# Patient Record
Sex: Female | Born: 1980 | Race: White | Hispanic: No | Marital: Single | Smoking: Former smoker
Health system: Southern US, Community
[De-identification: ages and names within clinical notes are randomized; demographics above are authoritative.]

## PROBLEM LIST (undated history)

## (undated) DIAGNOSIS — I10 Essential (primary) hypertension: Secondary | ICD-10-CM

## (undated) DIAGNOSIS — F329 Major depressive disorder, single episode, unspecified: Secondary | ICD-10-CM

## (undated) DIAGNOSIS — A749 Chlamydial infection, unspecified: Secondary | ICD-10-CM

## (undated) DIAGNOSIS — A599 Trichomoniasis, unspecified: Secondary | ICD-10-CM

## (undated) DIAGNOSIS — E538 Deficiency of other specified B group vitamins: Secondary | ICD-10-CM

## (undated) DIAGNOSIS — T883XXA Malignant hyperthermia due to anesthesia, initial encounter: Secondary | ICD-10-CM

## (undated) DIAGNOSIS — F32A Depression, unspecified: Secondary | ICD-10-CM

## (undated) DIAGNOSIS — E559 Vitamin D deficiency, unspecified: Secondary | ICD-10-CM

## (undated) DIAGNOSIS — F419 Anxiety disorder, unspecified: Secondary | ICD-10-CM

## (undated) HISTORY — PX: OTHER SURGICAL HISTORY: SHX169

## (undated) HISTORY — DX: Vitamin D deficiency, unspecified: E55.9

## (undated) HISTORY — DX: Deficiency of other specified B group vitamins: E53.8

---
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
## 2001-01-28 ENCOUNTER — Ambulatory Visit (HOSPITAL_COMMUNITY): Admission: RE | Admit: 2001-01-28 | Discharge: 2001-01-28 | Payer: Self-pay | Admitting: Oral Surgery

## 2002-12-07 ENCOUNTER — Emergency Department (HOSPITAL_COMMUNITY): Admission: EM | Admit: 2002-12-07 | Discharge: 2002-12-07 | Payer: Self-pay | Admitting: Emergency Medicine

## 2003-10-04 ENCOUNTER — Other Ambulatory Visit: Admission: RE | Admit: 2003-10-04 | Discharge: 2003-10-04 | Payer: Self-pay | Admitting: Family Medicine

## 2004-07-31 ENCOUNTER — Emergency Department (HOSPITAL_COMMUNITY): Admission: EM | Admit: 2004-07-31 | Discharge: 2004-07-31 | Payer: Self-pay | Admitting: Emergency Medicine

## 2004-11-22 ENCOUNTER — Other Ambulatory Visit: Admission: RE | Admit: 2004-11-22 | Discharge: 2004-11-22 | Payer: Self-pay | Admitting: Family Medicine

## 2005-01-16 ENCOUNTER — Emergency Department (HOSPITAL_COMMUNITY): Admission: EM | Admit: 2005-01-16 | Discharge: 2005-01-17 | Payer: Self-pay | Admitting: Emergency Medicine

## 2005-01-19 ENCOUNTER — Other Ambulatory Visit: Admission: RE | Admit: 2005-01-19 | Discharge: 2005-01-19 | Payer: Self-pay | Admitting: Obstetrics and Gynecology

## 2005-01-24 ENCOUNTER — Ambulatory Visit (HOSPITAL_COMMUNITY): Admission: RE | Admit: 2005-01-24 | Discharge: 2005-01-24 | Payer: Self-pay | Admitting: Obstetrics and Gynecology

## 2005-04-24 ENCOUNTER — Emergency Department (HOSPITAL_COMMUNITY): Admission: EM | Admit: 2005-04-24 | Discharge: 2005-04-24 | Payer: Self-pay | Admitting: Emergency Medicine

## 2006-09-27 ENCOUNTER — Other Ambulatory Visit: Admission: RE | Admit: 2006-09-27 | Discharge: 2006-09-27 | Payer: Self-pay | Admitting: Family Medicine

## 2007-03-23 ENCOUNTER — Emergency Department (HOSPITAL_COMMUNITY): Admission: EM | Admit: 2007-03-23 | Discharge: 2007-03-23 | Payer: Self-pay | Admitting: Family Medicine

## 2007-12-23 ENCOUNTER — Emergency Department (HOSPITAL_COMMUNITY): Admission: EM | Admit: 2007-12-23 | Discharge: 2007-12-23 | Payer: Self-pay | Admitting: Family Medicine

## 2008-02-10 ENCOUNTER — Inpatient Hospital Stay (HOSPITAL_COMMUNITY): Admission: AD | Admit: 2008-02-10 | Discharge: 2008-02-11 | Payer: Self-pay | Admitting: Obstetrics & Gynecology

## 2008-04-13 ENCOUNTER — Emergency Department (HOSPITAL_COMMUNITY): Admission: EM | Admit: 2008-04-13 | Discharge: 2008-04-13 | Payer: Self-pay | Admitting: Family Medicine

## 2009-10-19 ENCOUNTER — Emergency Department (HOSPITAL_COMMUNITY): Admission: EM | Admit: 2009-10-19 | Discharge: 2009-10-19 | Payer: Self-pay | Admitting: Family Medicine

## 2010-05-08 LAB — GC/CHLAMYDIA PROBE AMP, GENITAL: GC Probe Amp, Genital: NEGATIVE

## 2010-05-08 LAB — WET PREP, GENITAL
Trich, Wet Prep: NONE SEEN
Yeast Wet Prep HPF POC: NONE SEEN

## 2010-05-08 LAB — HCG, SERUM, QUALITATIVE: Preg, Serum: NEGATIVE

## 2010-06-09 NOTE — Op Note (Signed)
[facility]  Patient:    Anna, [name] Visit Number: [id number] MRN: [id number]          Service Type: DSU Location: [facility] 2874 01 Attending Physician:  Retia Passe Dictated by:   Vania Rea. Warren Danes, D.D.S. Proc. 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  OPERATION PERFORMED:  Removal of third molar teeth numbers 1, 16, 17, and 32.  SURGEON:  Vania Rea. Warren Danes, D.D.S.  ASSISTANT:  Su Hilt.  ANESTHESIA:  General via oral endotracheal intubation.  ESTIMATED BLOOD LOSS:  Less than 50 cc.  FLUID REPLACEMENT:  Approximately 100 cc crystalloid solutions.  COMPLICATIONS:  None apparent.  INDICATIONS FOR PROCEDURE:  The patient is a 30 year old female who was referred to my office for removal of her third molar teeth.  The patient presented with a history of malignant hyperthermia and following review with various physicians including anesthesiologists, it was recommended that the procedure be performed under operating room conditions.  DESCRIPTION OF PROCEDURE:  On January 28, 2001 Anna [name] was taken to the [employer] major operating suite where she was placed on the operating room table in a supine position.  Following successful oral endotracheal intubation and general anesthesia, the patients face, neck and oral cavity were prepped and draped in the usual sterile operating room fashion.  The hypopharynx was suctioned free of fluids and secretions, and a moistened 2-inch vaginal pack was placed as a throat pack.  Attention was then directed intraorally, where approximately 16 cc of 0.5%  Xylocaine containing 1:200,000 epinephrine were infiltrated in the maxillary right and left posterior and superior alveolar nerve distributions, into the corresponding palatal soft tissues, and the right and left inferior alveolar neurovascular regions. Attention was then directed towards the right maxillary arch, where a a #15 Bard Parker blade was used to create a 1.5 curvilinear incision over imbedded tooth #1.  A full thickness mucoperiosteal flap was elevated laterally and superiorly using a #9 Molt periosteal elevator.  The overlying cortical bone was then reduced using a Stryker rotary osteotome and a #8 round bur.  The imbedded tooth was then visualized and sectioned using the Stryker rotary osteotome and a straight fissure bur.  The portions of tooth were removed from the alveolus using an 11A elevator and rongeurs and cutting forceps.  Bony margins were then smoothed and contoured with a small osseous file and surround dental follicular tissue was curetted and removed using a double ended Molt curet.  The surgical site was copiously irrigated with sterile saline irrigating solutions and suctioned.  The mucoperiosteal margins were then approximated and sutured in an interrupted fashion using 4-0 chromic suture material on an RB1 needle.  In a similar fashion the remaining third molar teeth were removed as well.  The throat pack was removed and the hypopharynx suctioned free of fluids and secretions.  The patient was allowed to awaken from the anesthesia and taken to the recovery room where she tolerated the procedure well without apparent complication. Dictated by:   Vania Rea. Warren Danes, D.D.S. Attending Physician:  Hewitt Blade  Channing Mutters DD:  01/28/01 TD:  01/28/01 Job: [id number]

## 2010-09-27 ENCOUNTER — Inpatient Hospital Stay (INDEPENDENT_AMBULATORY_CARE_PROVIDER_SITE_OTHER)
Admission: RE | Admit: 2010-09-27 | Discharge: 2010-09-27 | Disposition: A | Discharge status: Home / Self Care | Payer: Self-pay | Source: Ambulatory Visit | Attending: Family Medicine | Admitting: Family Medicine

## 2010-09-27 DIAGNOSIS — K029 Dental caries, unspecified: Secondary | ICD-10-CM

## 2010-09-27 DIAGNOSIS — J019 Acute sinusitis, unspecified: Secondary | ICD-10-CM

## 2010-11-25 ENCOUNTER — Emergency Department (HOSPITAL_COMMUNITY)
Admission: EM | Admit: 2010-11-25 | Discharge: 2010-11-26 | Disposition: A | Discharge status: Home / Self Care | Payer: Self-pay | Attending: Emergency Medicine | Admitting: Emergency Medicine

## 2010-11-25 DIAGNOSIS — R109 Unspecified abdominal pain: Secondary | ICD-10-CM | POA: Insufficient documentation

## 2010-11-25 DIAGNOSIS — F17200 Nicotine dependence, unspecified, uncomplicated: Secondary | ICD-10-CM | POA: Insufficient documentation

## 2010-11-25 DIAGNOSIS — K8020 Calculus of gallbladder without cholecystitis without obstruction: Secondary | ICD-10-CM | POA: Insufficient documentation

## 2010-11-25 LAB — URINALYSIS, ROUTINE W REFLEX MICROSCOPIC
Bilirubin Urine: NEGATIVE
Ketones, ur: NEGATIVE mg/dL
pH: 6 (ref 5.0–8.0)

## 2010-11-25 LAB — URINE MICROSCOPIC-ADD ON

## 2010-11-26 ENCOUNTER — Emergency Department (HOSPITAL_COMMUNITY): Payer: Self-pay

## 2010-11-26 LAB — CBC
HCT: 40.6 % (ref 36.0–46.0)
Hemoglobin: 14.1 g/dL (ref 12.0–15.0)
MCH: 28.7 pg (ref 26.0–34.0)
MCHC: 34.7 g/dL (ref 30.0–36.0)
MCV: 82.5 fL (ref 78.0–100.0)
Platelets: 230 10*3/uL (ref 150–400)
RBC: 4.92 MIL/uL (ref 3.87–5.11)
RDW: 13.4 % (ref 11.5–15.5)
WBC: 17 10*3/uL — ABNORMAL HIGH (ref 4.0–10.5)

## 2010-11-26 LAB — COMPREHENSIVE METABOLIC PANEL
AST: 9 U/L (ref 0–37)
Alkaline Phosphatase: 75 U/L (ref 39–117)
BUN: 9 mg/dL (ref 6–23)
CO2: 22 mEq/L (ref 19–32)
GFR calc non Af Amer: >90 mL/min (ref >90)
Glucose, Bld: 135 mg/dL — ABNORMAL HIGH (ref 70–99)
Sodium: 137 mEq/L (ref 135–145)
Total Bilirubin: 0.3 mg/dL (ref 0.3–1.2)
Total Protein: 7.3 g/dL (ref 6.0–8.3)

## 2010-11-26 LAB — DIFFERENTIAL
Basophils Absolute: 0 10*3/uL (ref 0.0–0.1)
Basophils Relative: 0 % (ref 0–1)
Eosinophils Absolute: 0.1 10*3/uL (ref 0.0–0.7)
Eosinophils Relative: 1 % (ref 0–5)
Lymphocytes Relative: 12 % (ref 12–46)
Lymphs Abs: 2.1 10*3/uL (ref 0.7–4.0)
Monocytes Absolute: 0.9 K/uL (ref 0.1–1.0)
Monocytes Relative: 6 % (ref 3–12)
Neutro Abs: 13.9 10*3/uL — ABNORMAL HIGH (ref 1.7–7.7)
Neutrophils Relative %: 82 % — ABNORMAL HIGH (ref 43–77)

## 2010-11-26 LAB — COMPREHENSIVE METABOLIC PANEL WITH GFR
ALT: 7 U/L (ref 0–35)
Albumin: 3.6 g/dL (ref 3.5–5.2)
Calcium: 9.7 mg/dL (ref 8.4–10.5)
Chloride: 104 meq/L (ref 96–112)
Creatinine, Ser: 0.75 mg/dL (ref 0.50–1.10)
GFR calc Af Amer: >90 mL/min (ref >90)
Potassium: 3.5 meq/L (ref 3.5–5.1)

## 2010-11-26 LAB — LIPASE, BLOOD: Lipase: 17 U/L (ref 11–59)

## 2010-12-18 ENCOUNTER — Ambulatory Visit (INDEPENDENT_AMBULATORY_CARE_PROVIDER_SITE_OTHER): Payer: Self-pay | Admitting: Surgery

## 2011-04-11 ENCOUNTER — Other Ambulatory Visit: Payer: Self-pay | Admitting: Physician Assistant

## 2011-04-11 ENCOUNTER — Other Ambulatory Visit (HOSPITAL_COMMUNITY)
Admission: RE | Admit: 2011-04-11 | Discharge: 2011-04-11 | Disposition: A | Discharge status: Home / Self Care | Payer: PRIVATE HEALTH INSURANCE | Source: Ambulatory Visit | Attending: Family Medicine | Admitting: Family Medicine

## 2011-04-11 DIAGNOSIS — Z01419 Encounter for gynecological examination (general) (routine) without abnormal findings: Secondary | ICD-10-CM | POA: Insufficient documentation

## 2011-04-13 ENCOUNTER — Encounter (INDEPENDENT_AMBULATORY_CARE_PROVIDER_SITE_OTHER): Payer: Self-pay | Admitting: Surgery

## 2011-04-13 ENCOUNTER — Ambulatory Visit (INDEPENDENT_AMBULATORY_CARE_PROVIDER_SITE_OTHER): Payer: PRIVATE HEALTH INSURANCE | Admitting: Surgery

## 2011-04-13 VITALS — BP 130/84 | HR 84 | Ht 67.0 in | Wt 284.2 lb

## 2011-04-13 DIAGNOSIS — K8020 Calculus of gallbladder without cholecystitis without obstruction: Secondary | ICD-10-CM

## 2011-04-13 NOTE — Progress Notes (Signed)
Patient ID: Anna [name], female   DOB: [birth year], 31 y.o.   MRN: [id number]  No chief complaint on file.   HPI Anna [name] is a 31 y.o. female.   HPI The patient presents for evaluation of gallstones. She is seen emergency room back in November 2012 after an episode of right upper quadrant pain, nausea and vomiting. This is after eating some sterile milk in the morning. The pain was sharp in nature located in the right upper quadrant without radiation. It was severe. It subsided.  She has had no other issues.  No past medical history on file.  No past surgical history on file.  No family history on file.  Social History History  Substance Use Topics  . Smoking status: Current Everyday Smoker  . Smokeless tobacco: Not on file  . Alcohol Use: Yes    Allergies  Allergen Reactions  . Uncoded Nonscreenable Allergen     Anesthesia meds caused seizures    Current Outpatient Prescriptions  Medication Sig Dispense Refill  . ALPRAZolam (XANAX) 0.5 MG tablet Take 0.5 mg by mouth at bedtime as needed.      Marland Kitchen escitalopram (LEXAPRO) 20 MG tablet Take 20 mg by mouth daily.        Review of Systems Review of Systems  Constitutional: Negative for fever, chills and unexpected weight change.  HENT: Negative for hearing loss, congestion, sore throat, trouble swallowing and voice change.   Eyes: Negative for visual disturbance.  Respiratory: Negative for cough and wheezing.   Cardiovascular: Negative for chest pain, palpitations and leg swelling.  Gastrointestinal: Negative for nausea, vomiting, abdominal pain, diarrhea, constipation, blood in stool, abdominal distention and anal bleeding.  Genitourinary: Negative for hematuria, vaginal bleeding and difficulty urinating.  Musculoskeletal: Negative for arthralgias.  Skin: Negative for rash and wound.  Neurological: Negative for seizures, syncope and headaches.  Hematological: Negative for adenopathy. Does not bruise/bleed easily.    Psychiatric/Behavioral: Negative for confusion.    Blood pressure 130/84, pulse 84, height 5\' 7"  (1.702 m), weight 284 lb 3.2 oz (128.912 kg).  Physical Exam Physical Exam  Constitutional: She is oriented to person, place, and time. She appears well-developed and well-nourished.  HENT:  Head: Normocephalic and atraumatic.  Eyes: EOM are normal. Pupils are equal, round, and reactive to light.  Neck: Normal range of motion. Neck supple.  Cardiovascular: Normal rate and regular rhythm.   Pulmonary/Chest: Effort normal and breath sounds normal.  Abdominal: Soft. Bowel sounds are normal. She exhibits no distension.  Neurological: She is alert and oriented to person, place, and time.  Skin: Skin is warm and dry.  Psychiatric: She has a normal mood and affect. Her behavior is normal. Judgment and thought content normal.    Data Reviewed U/S abdomen 11/12  Gallstones    Labs WNL Assessment    Symptomatic cholelithiasis    Plan    Laparoscopic cholecystectomy and cholangiogram.The procedure has been discussed with the patient. Operative and non operative treatments have been discussed. Risks of surgery include bleeding, infection,  Common bile duct injury,  Injury to the stomach,liver, colon,small intestine, abdominal wall,  Diaphragm,  Major blood vessels,  And the need for an open procedure.  Other risks include worsening of medical problems, death,  DVT and pulmonary embolism, and cardiovascular events.   Medical options have also been discussed. The patient has been informed of long term expectations of surgery and non surgical options,  The patient agrees to proceed.  Eran Windish A. 04/13/2011, 11:57 AM

## 2011-04-13 NOTE — Patient Instructions (Signed)

## 2011-05-11 ENCOUNTER — Ambulatory Visit (HOSPITAL_COMMUNITY)
Admission: RE | Admit: 2011-05-11 | Discharge: 2011-05-11 | Disposition: A | Discharge status: Home / Self Care | Payer: PRIVATE HEALTH INSURANCE | Source: Ambulatory Visit | Attending: Surgery | Admitting: Surgery

## 2011-05-11 ENCOUNTER — Encounter (HOSPITAL_COMMUNITY)
Admission: RE | Admit: 2011-05-11 | Discharge: 2011-05-11 | Disposition: A | Discharge status: Home / Self Care | Payer: PRIVATE HEALTH INSURANCE | Source: Ambulatory Visit | Attending: Surgery | Admitting: Surgery

## 2011-05-11 ENCOUNTER — Encounter (HOSPITAL_COMMUNITY): Payer: Self-pay

## 2011-05-11 ENCOUNTER — Encounter (HOSPITAL_COMMUNITY): Payer: Self-pay | Admitting: Pharmacy Technician

## 2011-05-11 DIAGNOSIS — Z01818 Encounter for other preprocedural examination: Secondary | ICD-10-CM | POA: Insufficient documentation

## 2011-05-11 DIAGNOSIS — Z01812 Encounter for preprocedural laboratory examination: Secondary | ICD-10-CM | POA: Insufficient documentation

## 2011-05-11 HISTORY — DX: Depression, unspecified: F32.A

## 2011-05-11 HISTORY — DX: Major depressive disorder, single episode, unspecified: F32.9

## 2011-05-11 HISTORY — DX: Anxiety disorder, unspecified: F41.9

## 2011-05-11 HISTORY — DX: Malignant hyperthermia due to anesthesia, initial encounter: T88.3XXA

## 2011-05-11 LAB — CBC
HCT: 39.3 % (ref 36.0–46.0)
MCH: 27.5 pg (ref 26.0–34.0)
MCHC: 32.6 g/dL (ref 30.0–36.0)
MCV: 84.3 fL (ref 78.0–100.0)
Platelets: 234 10*3/uL (ref 150–400)
RDW: 14.4 % (ref 11.5–15.5)
WBC: 8.5 10*3/uL (ref 4.0–10.5)

## 2011-05-11 LAB — COMPREHENSIVE METABOLIC PANEL
AST: 11 U/L (ref 0–37)
Albumin: 3.1 g/dL — ABNORMAL LOW (ref 3.5–5.2)
BUN: 10 mg/dL (ref 6–23)
Calcium: 8.8 mg/dL (ref 8.4–10.5)
Chloride: 104 mEq/L (ref 96–112)
Creatinine, Ser: 0.83 mg/dL (ref 0.50–1.10)
Total Bilirubin: 0.3 mg/dL (ref 0.3–1.2)
Total Protein: 6.6 g/dL (ref 6.0–8.3)

## 2011-05-11 LAB — SURGICAL PCR SCREEN: MRSA, PCR: NEGATIVE

## 2011-05-11 LAB — DIFFERENTIAL
Basophils Relative: 0 % (ref 0–1)
Lymphocytes Relative: 31 % (ref 12–46)
Monocytes Absolute: 0.4 10*3/uL (ref 0.1–1.0)
Monocytes Relative: 5 % (ref 3–12)
Neutro Abs: 5 10*3/uL (ref 1.7–7.7)
Neutrophils Relative %: 58 % (ref 43–77)

## 2011-05-11 MED ORDER — CHLORHEXIDINE GLUCONATE 4 % EX LIQD: 1.0000 "application " | Freq: Once | CUTANEOUS | Status: DC

## 2011-05-11 NOTE — Patient Instructions (Addendum)
YOUR SURGERY IS SCHEDULED ON: WED  4/24  AT 11:11 AM  REPORT TO [facility] AT:  9:00 AM      PHONE # FOR SHORT STAY IS [phone]  DO NOT EAT OR DRINK ANYTHING AFTER MIDNIGHT THE NIGHT BEFORE YOUR SURGERY.  YOU MAY BRUSH YOUR TEETH, RINSE OUT YOUR MOUTH--BUT NO WATER, NO FOOD, NO CHEWING GUM, NO MINTS, NO CANDIES, NO CHEWING TOBACCO.  PLEASE TAKE THE FOLLOWING MEDICATIONS THE AM OF YOUR SURGERY WITH A FEW SIPS OF WATER:  ALPRAZOLAM AND LEXAPRO    IF YOU USE INHALERS--USE YOUR INHALERS THE AM OF YOUR SURGERY AND BRING INHALERS TO THE HOSPITAL -TAKE TO SURGERY.    IF YOU ARE DIABETIC:  DO NOT TAKE ANY DIABETIC MEDICATIONS THE AM OF YOUR SURGERY.  IF YOU TAKE INSULIN IN THE EVENINGS--PLEASE ONLY TAKE 1/2 NORMAL EVENING DOSE THE NIGHT BEFORE YOUR SURGERY.  NO INSULIN THE AM OF YOUR SURGERY.  IF YOU HAVE SLEEP APNEA AND USE CPAP OR BIPAP--PLEASE BRING THE MASK --NOT THE MACHINE-NOT THE TUBING   -JUST THE MASK. DO NOT BRING VALUABLES, MONEY, CREDIT CARDS.  CONTACT LENS, DENTURES / PARTIALS, GLASSES SHOULD NOT BE WORN TO SURGERY AND IN MOST CASES-HEARING AIDS WILL NEED TO BE REMOVED.  BRING YOUR GLASSES CASE, ANY EQUIPMENT NEEDED FOR YOUR CONTACT LENS. FOR PATIENTS ADMITTED TO THE HOSPITAL--CHECK OUT TIME THE DAY OF DISCHARGE IS 11:00 AM.  ALL INPATIENT ROOMS ARE PRIVATE - WITH BATHROOM, TELEPHONE, TELEVISION AND WIFI INTERNET. IF YOU ARE BEING DISCHARGED THE SAME DAY OF YOUR SURGERY--YOU CAN NOT DRIVE YOURSELF HOME--AND SHOULD NOT GO HOME ALONE BY TAXI OR BUS.  NO DRIVING OR OPERATING MACHINERY FOR 24 HOURS FOLLOWING ANESTHESIA / PAIN MEDICATIONS.                            SPECIAL INSTRUCTIONS:  CHLORHEXIDINE SOAP SHOWER (other brand names are Betasept and Hibiclens ) PLEASE SHOWER WITH CHLORHEXIDINE THE NIGHT BEFORE YOUR SURGERY AND THE AM OF YOUR SURGERY. DO NOT USE CHLORHEXIDINE ON YOUR FACE OR PRIVATE AREAS--YOU MAY USE YOUR NORMAL SOAP THOSE AREAS AND YOUR NORMAL SHAMPOO.    WOMEN SHOULD AVOID SHAVING UNDER ARMS AND SHAVING LEGS 48 HOURS BEFORE USING CHLORHEXIDINE TO AVOID SKIN IRRITATION.  DO NOT USE IF ALLERGIC TO CHLORHEXIDINE.  PLEASE READ OVER ANY  FACT SHEETS THAT YOU WERE GIVEN: MRSA INFORMATION   PT NOTIFIED HER SURGERY TIME CHANGED TO 8:30 AM TOMORROW  4/24 AND SHE IS TO ARRIVE TO SHORT STAY BY 6:30 AM--ALL OTHER INSTRUCTIONS THE SAME.

## 2011-05-11 NOTE — Pre-Procedure Instructions (Signed)
PT DOES NOT NEED EKG PER ANESTHESIOLOGIST'S REQUIREMENTS.  CXR, CBC WITH DIFF, CMET, SERUM PREGNANCY TEST WERE DONE PREOP TODAY AT [facility].  DR. Council Mechanic NOTIFIED OF PT'S HX OF MALIGNANT HYPERTHERMIA--AND PER HIS REQUEST - ANN IN OR POSTING NOTIFIED TO PUT ON OR SCHEDULE PT'S HX OF MALIGNANT HYPERTHERMIA.

## 2011-05-16 ENCOUNTER — Encounter (HOSPITAL_COMMUNITY)
Admission: RE | Disposition: A | Discharge status: Home / Self Care | Payer: Self-pay | Source: Ambulatory Visit | Attending: Surgery

## 2011-05-16 ENCOUNTER — Encounter (HOSPITAL_COMMUNITY): Payer: Self-pay | Admitting: *Deleted

## 2011-05-16 ENCOUNTER — Ambulatory Visit (HOSPITAL_COMMUNITY)
Admission: RE | Admit: 2011-05-16 | Discharge: 2011-05-16 | Disposition: A | Discharge status: Home / Self Care | Payer: PRIVATE HEALTH INSURANCE | Source: Ambulatory Visit | Attending: Surgery | Admitting: Surgery

## 2011-05-16 ENCOUNTER — Encounter (HOSPITAL_COMMUNITY): Payer: Self-pay | Admitting: Registered Nurse

## 2011-05-16 ENCOUNTER — Ambulatory Visit (HOSPITAL_COMMUNITY): Payer: PRIVATE HEALTH INSURANCE | Admitting: Registered Nurse

## 2011-05-16 ENCOUNTER — Ambulatory Visit (HOSPITAL_COMMUNITY): Payer: PRIVATE HEALTH INSURANCE

## 2011-05-16 DIAGNOSIS — K8010 Calculus of gallbladder with chronic cholecystitis without obstruction: Secondary | ICD-10-CM | POA: Insufficient documentation

## 2011-05-16 DIAGNOSIS — K8020 Calculus of gallbladder without cholecystitis without obstruction: Secondary | ICD-10-CM

## 2011-05-16 DIAGNOSIS — F17200 Nicotine dependence, unspecified, uncomplicated: Secondary | ICD-10-CM | POA: Insufficient documentation

## 2011-05-16 DIAGNOSIS — Z79899 Other long term (current) drug therapy: Secondary | ICD-10-CM | POA: Insufficient documentation

## 2011-05-16 DIAGNOSIS — K824 Cholesterolosis of gallbladder: Secondary | ICD-10-CM

## 2011-05-16 HISTORY — PX: CHOLECYSTECTOMY: SHX55

## 2011-05-16 SURGERY — LAPAROSCOPIC CHOLECYSTECTOMY WITH INTRAOPERATIVE CHOLANGIOGRAM
Anesthesia: General | Site: Abdomen | Wound class: Contaminated

## 2011-05-16 MED ORDER — IOHEXOL 300 MG/ML  SOLN
INTRAMUSCULAR | Status: AC
  Filled 2011-05-16: qty 1

## 2011-05-16 MED ORDER — BUPIVACAINE-EPINEPHRINE 0.25% -1:200000 IJ SOLN
INTRAMUSCULAR | Status: AC
  Filled 2011-05-16: qty 1

## 2011-05-16 MED ORDER — LACTATED RINGERS IV SOLN
INTRAVENOUS | Status: DC | PRN
  Administered 2011-05-16: 08:00:00 via INTRAVENOUS

## 2011-05-16 MED ORDER — NEOSTIGMINE METHYLSULFATE 1 MG/ML IJ SOLN
INTRAMUSCULAR | Status: DC | PRN
  Administered 2011-05-16: 5 mg via INTRAVENOUS

## 2011-05-16 MED ORDER — HYDROMORPHONE HCL PF 1 MG/ML IJ SOLN
0.2500 mg | INTRAMUSCULAR | Status: DC | PRN
  Administered 2011-05-16: 0.25 mg via INTRAVENOUS
  Administered 2011-05-16: 0.5 mg via INTRAVENOUS
  Administered 2011-05-16: 0.25 mg via INTRAVENOUS

## 2011-05-16 MED ORDER — OXYCODONE-ACETAMINOPHEN 5-325 MG PO TABS: 1.0000 | ORAL_TABLET | ORAL | Status: AC | PRN

## 2011-05-16 MED ORDER — CEFAZOLIN SODIUM-DEXTROSE 2-3 GM-% IV SOLR
INTRAVENOUS | Status: AC
  Filled 2011-05-16: qty 50

## 2011-05-16 MED ORDER — PROMETHAZINE HCL 25 MG/ML IJ SOLN
INTRAMUSCULAR | Status: AC
  Filled 2011-05-16: qty 1

## 2011-05-16 MED ORDER — PROMETHAZINE HCL 25 MG/ML IJ SOLN
6.2500 mg | Freq: Once | INTRAMUSCULAR | Status: AC
  Administered 2011-05-16: 6.25 mg via INTRAVENOUS

## 2011-05-16 MED ORDER — HYDROMORPHONE HCL PF 1 MG/ML IJ SOLN
INTRAMUSCULAR | Status: AC
  Filled 2011-05-16: qty 1

## 2011-05-16 MED ORDER — LACTATED RINGERS IV SOLN: INTRAVENOUS | Status: DC

## 2011-05-16 MED ORDER — PROMETHAZINE HCL 25 MG/ML IJ SOLN
INTRAMUSCULAR | Status: AC
  Administered 2011-05-16: 6.25 mg via INTRAVENOUS
  Filled 2011-05-16: qty 1

## 2011-05-16 MED ORDER — ONDANSETRON HCL 4 MG/2ML IJ SOLN
4.0000 mg | Freq: Four times a day (QID) | INTRAMUSCULAR | Status: DC | PRN
  Administered 2011-05-16: 4 mg via INTRAVENOUS

## 2011-05-16 MED ORDER — MIDAZOLAM HCL 5 MG/5ML IJ SOLN
INTRAMUSCULAR | Status: DC | PRN
  Administered 2011-05-16: 2 mg via INTRAVENOUS

## 2011-05-16 MED ORDER — LACTATED RINGERS IV SOLN
INTRAVENOUS | Status: DC
  Administered 2011-05-16: 1000 mL via INTRAVENOUS

## 2011-05-16 MED ORDER — PROPOFOL 10 MG/ML IV EMUL
INTRAVENOUS | Status: DC | PRN
  Administered 2011-05-16: 200 mg via INTRAVENOUS

## 2011-05-16 MED ORDER — DEXAMETHASONE SODIUM PHOSPHATE 10 MG/ML IJ SOLN
INTRAMUSCULAR | Status: DC | PRN
  Administered 2011-05-16: 10 mg via INTRAVENOUS

## 2011-05-16 MED ORDER — SODIUM CHLORIDE 0.9 % IJ SOLN
INTRAMUSCULAR | Status: DC | PRN
  Administered 2011-05-16: 10:00:00

## 2011-05-16 MED ORDER — FENTANYL CITRATE 0.05 MG/ML IJ SOLN
INTRAMUSCULAR | Status: DC | PRN
  Administered 2011-05-16 (×7): 50 ug via INTRAVENOUS

## 2011-05-16 MED ORDER — GLYCOPYRROLATE 0.2 MG/ML IJ SOLN
INTRAMUSCULAR | Status: DC | PRN
  Administered 2011-05-16: .8 mg via INTRAVENOUS

## 2011-05-16 MED ORDER — BUPIVACAINE-EPINEPHRINE 0.25% -1:200000 IJ SOLN
INTRAMUSCULAR | Status: DC | PRN
  Administered 2011-05-16: 10 mL

## 2011-05-16 MED ORDER — ONDANSETRON HCL 4 MG/2ML IJ SOLN
INTRAMUSCULAR | Status: DC | PRN
  Administered 2011-05-16: 4 mg via INTRAVENOUS

## 2011-05-16 MED ORDER — OXYCODONE HCL 5 MG PO TABS
ORAL_TABLET | ORAL | Status: AC
  Administered 2011-05-16: 5 mg via ORAL
  Filled 2011-05-16: qty 1

## 2011-05-16 MED ORDER — KETAMINE HCL 50 MG/ML IJ SOLN
INTRAMUSCULAR | Status: DC | PRN
  Administered 2011-05-16 (×2): 50 mg via INTRAMUSCULAR

## 2011-05-16 MED ORDER — LIDOCAINE HCL (CARDIAC) 20 MG/ML IV SOLN
INTRAVENOUS | Status: DC | PRN
  Administered 2011-05-16: 100 mg via INTRAVENOUS

## 2011-05-16 MED ORDER — OXYCODONE HCL 5 MG PO TABS
5.0000 mg | ORAL_TABLET | ORAL | Status: DC | PRN
  Administered 2011-05-16: 5 mg via ORAL

## 2011-05-16 MED ORDER — ROCURONIUM BROMIDE 100 MG/10ML IV SOLN
INTRAVENOUS | Status: DC | PRN
  Administered 2011-05-16: 50 mg via INTRAVENOUS
  Administered 2011-05-16: 20 mg via INTRAVENOUS

## 2011-05-16 MED ORDER — CEFAZOLIN SODIUM-DEXTROSE 2-3 GM-% IV SOLR
2.0000 g | INTRAVENOUS | Status: AC
  Administered 2011-05-16: 2 g via INTRAVENOUS

## 2011-05-16 MED ORDER — PROPOFOL 10 MG/ML IV EMUL
INTRAVENOUS | Status: DC | PRN
  Administered 2011-05-16: 75 ug/kg/min via INTRAVENOUS

## 2011-05-16 MED ORDER — ONDANSETRON HCL 4 MG/2ML IJ SOLN
INTRAMUSCULAR | Status: AC
Start: 2011-05-16 — End: 2011-05-16
  Administered 2011-05-16: 4 mg via INTRAVENOUS
  Filled 2011-05-16: qty 2

## 2011-05-16 SURGICAL SUPPLY — 39 items
ADH SKN CLS APL DERMABOND .7 (GAUZE/BANDAGES/DRESSINGS) ×1
APPLIER CLIP ROT 10 11.4 M/L (STAPLE) ×2
APR CLP MED LRG 11.4X10 (STAPLE) ×1
BAG SPEC RTRVL LRG 6X4 10 (ENDOMECHANICALS)
CANISTER SUCTION 2500CC (MISCELLANEOUS) ×2 IMPLANT
CHLORAPREP W/TINT 26ML (MISCELLANEOUS) ×2 IMPLANT
CLIP APPLIE ROT 10 11.4 M/L (STAPLE) ×1 IMPLANT
CLOTH BEACON ORANGE TIMEOUT ST (SAFETY) ×2 IMPLANT
COVER MAYO STAND STRL (DRAPES) ×2 IMPLANT
DECANTER SPIKE VIAL GLASS SM (MISCELLANEOUS) ×2 IMPLANT
DERMABOND ADVANCED (GAUZE/BANDAGES/DRESSINGS) ×1
DERMABOND ADVANCED .7 DNX12 (GAUZE/BANDAGES/DRESSINGS) IMPLANT
DRAPE C-ARM 42X72 X-RAY (DRAPES) ×2 IMPLANT
DRAPE LAPAROSCOPIC ABDOMINAL (DRAPES) ×2 IMPLANT
DRAPE WARM FLUID 44X44 (DRAPE) ×2 IMPLANT
ELECT REM PT RETURN 9FT ADLT (ELECTROSURGICAL) ×2
ELECTRODE REM PT RTRN 9FT ADLT (ELECTROSURGICAL) ×1 IMPLANT
FILTER SMOKE EVAC LAPAROSHD (FILTER) ×2 IMPLANT
GLOVE BIOGEL PI IND STRL 7.0 (GLOVE) ×1 IMPLANT
GLOVE BIOGEL PI INDICATOR 7.0 (GLOVE) ×1
GLOVE INDICATOR 8.0 STRL GRN (GLOVE) ×4 IMPLANT
GLOVE SS BIOGEL STRL SZ 8 (GLOVE) ×1 IMPLANT
GLOVE SUPERSENSE BIOGEL SZ 8 (GLOVE) ×1
GOWN STRL NON-REIN LRG LVL3 (GOWN DISPOSABLE) ×3 IMPLANT
GOWN STRL REIN XL XLG (GOWN DISPOSABLE) ×4 IMPLANT
HEMOSTAT SNOW SURGICEL 2X4 (HEMOSTASIS) ×1 IMPLANT
HEMOSTAT SURGICEL 4X8 (HEMOSTASIS) IMPLANT
KIT BASIN OR (CUSTOM PROCEDURE TRAY) ×2 IMPLANT
POUCH SPECIMEN RETRIEVAL 10MM (ENDOMECHANICALS) IMPLANT
SCISSORS LAP 5X35 DISP (ENDOMECHANICALS) IMPLANT
SET CHOLANGIOGRAPH MIX (MISCELLANEOUS) ×2 IMPLANT
SET IRRIG TUBING LAPAROSCOPIC (IRRIGATION / IRRIGATOR) ×2 IMPLANT
SUT MNCRL AB 4-0 PS2 18 (SUTURE) ×2 IMPLANT
TOWEL OR 17X26 10 PK STRL BLUE (TOWEL DISPOSABLE) ×2 IMPLANT
TRAY LAP CHOLE (CUSTOM PROCEDURE TRAY) ×2 IMPLANT
TROCAR BLADELESS OPT 5 75 (ENDOMECHANICALS) ×4 IMPLANT
TROCAR XCEL BLUNT TIP 100MML (ENDOMECHANICALS) ×2 IMPLANT
TROCAR XCEL NON-BLD 11X100MML (ENDOMECHANICALS) ×2 IMPLANT
TUBING INSUFFLATION 10FT LAP (TUBING) ×2 IMPLANT

## 2011-05-16 NOTE — Interval H&P Note (Signed)
History and Physical Interval Note:  05/16/2011 8:14 AM  Anna [name]  has presented today for surgery, with the diagnosis of gallstones  The various methods of treatment have been discussed with the patient and family. After consideration of risks, benefits and other options for treatment, the patient has consented to  Procedure(s) (LRB): LAPAROSCOPIC CHOLECYSTECTOMY WITH INTRAOPERATIVE CHOLANGIOGRAM (N/A) as a surgical intervention .  The patients' history has been reviewed, patient examined, no change in status, stable for surgery.  I have reviewed the patients' chart and labs.  Questions were answered to the patient's satisfaction.     Sangeeta Youse A.

## 2011-05-16 NOTE — Discharge Instructions (Signed)
[facility] ______[facility] °LAPAROSCOPIC SURGERY: POST OP INSTRUCTIONS °Always review your discharge instruction sheet given to you by the facility where your surgery was performed. °IF YOU HAVE DISABILITY OR FAMILY LEAVE FORMS, YOU MUST BRING THEM TO THE OFFICE FOR PROCESSING.   °DO NOT GIVE THEM TO YOUR DOCTOR. ° °1. A prescription for pain medication may be given to you upon discharge.  Take your pain medication as prescribed, if needed.  If narcotic pain medicine is not needed, then you may take acetaminophen (Tylenol) or ibuprofen (Advil) as needed. °2. Take your usually prescribed medications unless otherwise directed. °3. If you need a refill on your pain medication, please contact your pharmacy.  They will contact our office to request authorization. Prescriptions will not be filled after 5pm or on week-ends. °4. You should follow a light diet the first few days after arrival home, such as soup and crackers, etc.  Be sure to include lots of fluids daily. °5. Most patients will experience some swelling and bruising in the area of the incisions.  Ice packs will help.  Swelling and bruising can take several days to resolve.  °6. It is common to experience some constipation if taking pain medication after surgery.  Increasing fluid intake and taking a stool softener (such as Colace) will usually help or prevent this problem from occurring.  A mild laxative (Milk of Magnesia or Miralax) should be taken according to package instructions if there are no bowel movements after 48 hours. °7. Unless discharge instructions indicate otherwise, you may remove your bandages 24-48 hours after surgery, and you may shower at that time.  You may have steri-strips (small skin tapes) in place directly over the incision.  These strips should be left on the skin for 7-10 days.  If your surgeon used skin glue on the incision, you may shower in 24 hours.  The glue will flake off over the next 2-3 weeks.  Any sutures or  staples will be removed at the office during your follow-up visit. °8. ACTIVITIES:  You may resume regular (light) daily activities beginning the next day--such as daily self-care, walking, climbing stairs--gradually increasing activities as tolerated.  You may have sexual intercourse when it is comfortable.  Refrain from any heavy lifting or straining until approved by your doctor. °a. You may drive when you are no longer taking prescription pain medication, you can comfortably wear a seatbelt, and you can safely maneuver your car and apply brakes. °b. RETURN TO WORK:  __________________________________________________________ °9. You should see your doctor in the office for a follow-up appointment approximately 2-3 weeks after your surgery.  Make sure that you call for this appointment within a day or two after you arrive home to insure a convenient appointment time. °10. OTHER INSTRUCTIONS: __________________________________________________________________________________________________________________________ __________________________________________________________________________________________________________________________ °WHEN TO CALL YOUR DOCTOR: °1. Fever over 101.0 °2. Inability to urinate °3. Continued bleeding from incision. °4. Increased pain, redness, or drainage from the incision. °5. Increasing abdominal pain ° °The clinic staff is available to answer your questions during regular business hours.  Please don’t hesitate to call and ask to speak to one of the nurses for clinical concerns.  If you have a medical emergency, go to the nearest emergency room or call 911.  A surgeon from [facility] is always on call at the hospital. °[address], [address], [place], [place]  [address] ? [address] [address], [place], [place]   [address] °[phone] ? [phone] ? FAX [phone] °Web site:   [email or web address] °

## 2011-05-16 NOTE — Op Note (Signed)
Laparoscopic Cholecystectomy with IOC Procedure Note  Indications: This patient presents with symptomatic gallbladder disease and will undergo laparoscopic cholecystectomy.  Pre-operative Diagnosis: Calculus of gallbladder without mention of cholecystitis or obstruction  Post-operative Diagnosis: Same  Surgeon: Jazlin Tapscott A.   Assistants: OR  Anesthesia: General endotracheal anesthesia and Local anesthesia 0.25.% bupivacaine, with epinephrine  ASA Class: 2  Procedure Details  The patient was seen again in the Holding Room. The risks, benefits, complications, treatment options, and expected outcomes were discussed with the patient. The possibilities of reaction to medication, pulmonary aspiration, perforation of viscus, bleeding, recurrent infection, finding a normal gallbladder, the need for additional procedures, failure to diagnose a condition, the possible need to convert to an open procedure, and creating a complication requiring transfusion or operation were discussed with the patient. The patient and/or family concurred with the proposed plan, giving informed consent. The site of surgery properly noted/marked. The patient was taken to Operating Room, identified as Anna [name] and the procedure verified as Laparoscopic Cholecystectomy with Intraoperative Cholangiograms. A Time Out was held and the above information confirmed.  Prior to the induction of general anesthesia, antibiotic prophylaxis was administered. General endotracheal anesthesia was then administered and tolerated well. After the induction, the abdomen was prepped in the usual sterile fashion. The patient was positioned in the supine position with the left arm comfortably tucked, along with some reverse Trendelenburg.  Local anesthetic agent was injected into the skin near the umbilicus and an incision made. The midline fascia was incised and the Hasson technique was used to introduce a 10 mm port under direct vision.  It was secured with a figure of eight Vicryl suture placed in the usual fashion. Pneumoperitoneum was then created with CO2 and tolerated well without any adverse changes in the patient's vital signs. Additional trocars were introduced under direct vision. All skin incisions were infiltrated with a local anesthetic agent before making the incision and placing the trocars.   The gallbladder was identified, the fundus grasped and retracted cephalad. Adhesions were lysed bluntly and with the electrocautery where indicated, taking care not to injure any adjacent organs or viscus. The infundibulum was grasped and retracted laterally, exposing the peritoneum overlying the triangle of Calot. This was then divided and exposed in a blunt fashion. The cystic duct was clearly identified and bluntly dissected circumferentially. The junctions of the gallbladder, cystic duct and common bile duct were clearly identified prior to the division of any linear structure .   An incision was made in the cystic duct and the cholangiogram catheter introduced. The catheter was secured using an endoclip. The study showed no stones and good visualization of the distal and proximal biliary tree. The catheter was then removed.   The cystic duct was then ligated with surgical clipsand singly clipped on the gallbladder side and divided. The cystic artery was identified, dissected free, ligated with clips and divided as well. Posterior branch of cystic artery clipped and divided.  The gallbladder was dissected from the liver bed in retrograde fashion with the electrocautery. The gallbladder was removed. The liver bed was irrigated and inspected. Hemostasis was achieved with the electrocautery. Copious irrigation was utilized and was repeatedly aspirated until clear all particulate matter.Surgicel snow placed in gallbladder bed.  Pneumoperitoneum was completely reduced after viewing removal of the trocars under direct vision. The wound  was thoroughly irrigated and the fascia was then closed with a figure of eight suture; the skin was then closed with 4 0 monocryl and a  sterile dressing was applied of Dermabond.  Instrument, sponge, and needle counts were correct at closure and at the conclusion of the case.   Findings: Cholecystitis without Cholelithiasis  Estimated Blood Loss: less than 50 mL         Drains: none         Total IV Fluids:         Specimens: Gallbladder           Complications: None; patient tolerated the procedure well.         Disposition: PACU - hemodynamically stable.         Condition: stable

## 2011-05-16 NOTE — Transfer of Care (Signed)
Immediate Anesthesia Transfer of Care Note  Patient: Anna [name]  Procedure(s) Performed: Procedure(s) (LRB): LAPAROSCOPIC CHOLECYSTECTOMY WITH INTRAOPERATIVE CHOLANGIOGRAM (N/A)  Patient Location: PACU  Anesthesia Type: General  Level of Consciousness: awake, alert , oriented and patient cooperative  Airway & Oxygen Therapy: Patient Spontanous Breathing and Patient connected to face mask oxygen  Post-op Assessment: Report given to PACU RN, Post -op Vital signs reviewed and stable and Patient moving all extremities  Post vital signs: Reviewed and stable  Complications: No apparent anesthesia complications

## 2011-05-16 NOTE — Anesthesia Preprocedure Evaluation (Addendum)
Anesthesia Evaluation  Patient identified by MRN, date of birth, ID band Patient awake    Reviewed: Allergy & Precautions, H&P , NPO status , Patient's Chart, lab work & pertinent test results  History of Anesthesia Complications (+) AWARENESS UNDER ANESTHESIA and MALIGNANT HYPERTHERMIA  Airway Mallampati: II TM Distance: >3 FB Neck ROM: full    Dental No notable dental hx. (+) Teeth Intact and Dental Advisory Given   Pulmonary neg pulmonary ROS,  breath sounds clear to auscultation  Pulmonary exam normal       Cardiovascular Exercise Tolerance: Good negative cardio ROS  Rhythm:regular Rate:Normal     Neuro/Psych negative neurological ROS  negative psych ROS   GI/Hepatic negative GI ROS, Neg liver ROS,   Endo/Other  negative endocrine ROS  Renal/GU negative Renal ROS  negative genitourinary   Musculoskeletal   Abdominal   Peds  Hematology negative hematology ROS (+)   Anesthesia Other Findings   Reproductive/Obstetrics negative OB ROS                          Anesthesia Physical Anesthesia Plan  ASA: II  Anesthesia Plan: General   Post-op Pain Management:    Induction: Intravenous  Airway Management Planned: Oral ETT  Additional Equipment:   Intra-op Plan:   Post-operative Plan: Extubation in OR  Informed Consent: I have reviewed the patients History and Physical, chart, labs and discussed the procedure including the risks, benefits and alternatives for the proposed anesthesia with the patient or authorized representative who has indicated his/her understanding and acceptance.   Dental Advisory Given  Plan Discussed with: CRNA and Surgeon  Anesthesia Plan Comments:         Anesthesia Quick Evaluation

## 2011-05-16 NOTE — H&P (Addendum)
Anna [name]          Diagnoses     Gallstones   - Primary    574.20         Vitals - Last Recorded       BP Pulse Ht Wt BMI    130/84  84  5\' 7"  (1.702 m)  284 lb 3.2 oz (128.912 kg)  44.51 kg/m2        Patient ID: Anna [name], female   DOB: 14-Feb-1980, 31 y.o.   MRN: [id number]   No chief complaint on file.   HPI Anna [name] is a 31 y.o. female.   HPI The patient presents for evaluation of gallstones. She is seen emergency room back in November 2012 after an episode of right upper quadrant pain, nausea and vomiting. This is after eating some sterile milk in the morning. The pain was sharp in nature located in the right upper quadrant without radiation. It was severe. It subsided.  She has had no other issues.   No past medical history on file.   No past surgical history on file.   No family history on file.   Social History History   Substance Use Topics   .  Smoking status:  Current Everyday Smoker   .  Smokeless tobacco:  Not on file   .  Alcohol Use:  Yes       Allergies   Allergen  Reactions   .  Uncoded Nonscreenable Allergen         Anesthesia meds caused seizures       Current Outpatient Prescriptions   Medication  Sig  Dispense  Refill   .  ALPRAZolam (XANAX) 0.5 MG tablet  Take 0.5 mg by mouth at bedtime as needed.         Marland Kitchen  escitalopram (LEXAPRO) 20 MG tablet  Take 20 mg by mouth daily.            Review of Systems Review of Systems  Constitutional: Negative for fever, chills and unexpected weight change.  HENT: Negative for hearing loss, congestion, sore throat, trouble swallowing and voice change.   Eyes: Negative for visual disturbance.  Respiratory: Negative for cough and wheezing.   Cardiovascular: Negative for chest pain, palpitations and leg swelling.  Gastrointestinal: Negative for nausea, vomiting, abdominal pain, diarrhea, constipation, blood in stool, abdominal distention and anal bleeding.  Genitourinary: Negative  for hematuria, vaginal bleeding and difficulty urinating.  Musculoskeletal: Negative for arthralgias.  Skin: Negative for rash and wound.  Neurological: Negative for seizures, syncope and headaches.  Hematological: Negative for adenopathy. Does not bruise/bleed easily.  Psychiatric/Behavioral: Negative for confusion.    Blood pressure 130/84, pulse 84, height 5\' 7"  (1.702 m), weight 284 lb 3.2 oz (128.912 kg).   Physical Exam Physical Exam  Constitutional: She is oriented to person, place, and time. She appears well-developed and well-nourished.  HENT:   Head: Normocephalic and atraumatic.  Eyes: EOM are normal. Pupils are equal, round, and reactive to light.  Neck: Normal range of motion. Neck supple.  Cardiovascular: Normal rate and regular rhythm.   Pulmonary/Chest: Effort normal and breath sounds normal.  Abdominal: Soft. Bowel sounds are normal. She exhibits no distension.  Neurological: She is alert and oriented to person, place, and time.  Skin: Skin is warm and dry.  Psychiatric: She has a normal mood and affect. Her behavior is normal. Judgment and thought content normal.    Data Reviewed U/S abdomen 11/12  Gallstones    Labs WNL Assessment Symptomatic cholelithiasis   Plan Laparoscopic cholecystectomy and cholangiogram.The procedure has been discussed with the patient. Operative and non operative treatments have been discussed. Risks of surgery include bleeding, infection,  Common bile duct injury,  Injury to the stomach,liver, colon,small intestine, abdominal wall,  Diaphragm,  Major blood vessels,  And the need for an open procedure.  Other risks include worsening of medical problems, death,  DVT and pulmonary embolism, and cardiovascular events.   Medical options have also been discussed. The patient has been informed of long term expectations of surgery and non surgical options,  The patient agrees to proceed.         Porfiria Heinrich A. 05/16/2011                 Not recorded

## 2011-05-16 NOTE — Preoperative (Signed)
Beta Blockers   Reason not to administer Beta Blockers:Not Applicable 

## 2011-05-16 NOTE — Anesthesia Postprocedure Evaluation (Signed)
  Anesthesia Post-op Note  Patient: Anna [name]  Procedure(s) Performed: Procedure(s) (LRB): LAPAROSCOPIC CHOLECYSTECTOMY WITH INTRAOPERATIVE CHOLANGIOGRAM (N/A)  Patient Location: PACU  Anesthesia Type: General  Level of Consciousness: awake and alert   Airway and Oxygen Therapy: Patient Spontanous Breathing  Post-op Pain: mild  Post-op Assessment: Post-op Vital signs reviewed, Patient's Cardiovascular Status Stable, Respiratory Function Stable, Patent Airway and No signs of Nausea or vomiting  Post-op Vital Signs: stable  Complications: No apparent anesthesia complications

## 2011-05-21 ENCOUNTER — Encounter (HOSPITAL_COMMUNITY): Payer: Self-pay | Admitting: Surgery

## 2011-06-08 ENCOUNTER — Encounter (INDEPENDENT_AMBULATORY_CARE_PROVIDER_SITE_OTHER): Payer: Self-pay | Admitting: Surgery

## 2011-06-08 ENCOUNTER — Ambulatory Visit (INDEPENDENT_AMBULATORY_CARE_PROVIDER_SITE_OTHER): Payer: PRIVATE HEALTH INSURANCE | Admitting: Surgery

## 2011-06-08 VITALS — BP 121/79 | HR 74 | Temp 97.8°F | Resp 16 | Ht 67.0 in | Wt 284.8 lb

## 2011-06-08 DIAGNOSIS — G8918 Other acute postprocedural pain: Secondary | ICD-10-CM

## 2011-06-08 MED ORDER — OXYCODONE-ACETAMINOPHEN 5-325 MG PO TABS: 1.0000 | ORAL_TABLET | Freq: Four times a day (QID) | ORAL | Status: DC | PRN

## 2011-06-08 MED ORDER — PROMETHAZINE HCL 12.5 MG PO TABS: 12.5000 mg | ORAL_TABLET | Freq: Four times a day (QID) | ORAL | Status: DC | PRN

## 2011-06-08 NOTE — Progress Notes (Signed)
NAME: Anna [name]       DOB: 10-Dec-1980           DATE: 06/08/2011       AVW:[id number]   CC: Postop laparoscopic cholecystectomy  HPI:  This patient underwent a laparoscopic cholecystectomy AND  operative cholangiogram on 05/09/2011. She is in for her first postoperative visit. She notes that her incisional pain has resolved. Her preoperative symptoms have improved. She is having problems with nausea, vomiting. She is tolerating diet. She feels that she isNOT progressing well. PE: General: The patient is alert and appears comfortable, NAD.  Abdomen: Soft and benign. The incisions are healing nicely. There are no apparent problems.  Data reviewed: IOC:  NORMAL Pathology:  CHRONIC CHOLECYSTITIS AND STONES  Impression:  Post op nausea and vomiting after lap chole and  IOC  Plan:  She will try phenergan for the next week.  Out of work for 3 weeks due to lifting required.  If nausea no better in the next week,  Refer to GI medicine.  Abdomen is benign on exam. Refill pain meds.

## 2011-06-08 NOTE — Patient Instructions (Signed)
Return to work in 3 weeks.  Full activity in 3 weeks.  If nausea no better i 1 week call and you will be referred to GI.

## 2011-07-02 ENCOUNTER — Encounter (INDEPENDENT_AMBULATORY_CARE_PROVIDER_SITE_OTHER): Payer: Self-pay

## 2012-10-01 IMAGING — US US ABDOMEN COMPLETE
1 series · 14 of 25 positions shown · non-contrast
Comparison: None.

CLINICAL DATA: Right upper quadrant abdominal pain, nausea,
vomiting and diarrhea.

COMPLETE ABDOMINAL ULTRASOUND

[Series 1: us abdomen complete · 0.24mm/px · 14 of 78 slices shown]
[im 1/78]
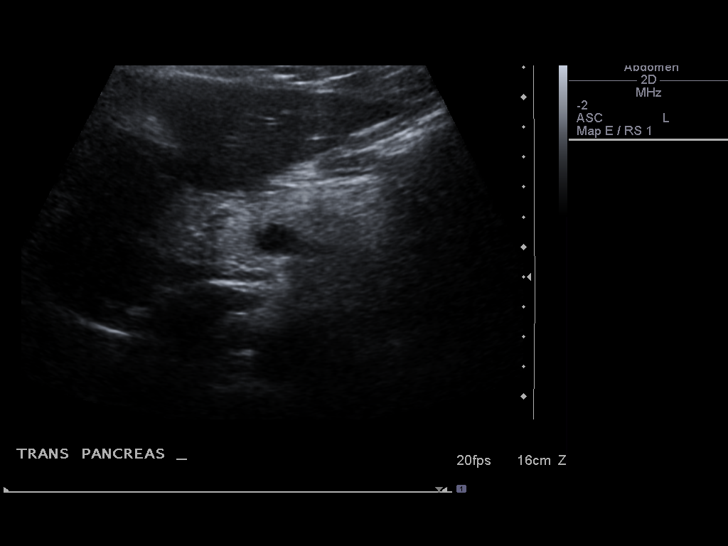
[im 7/78]
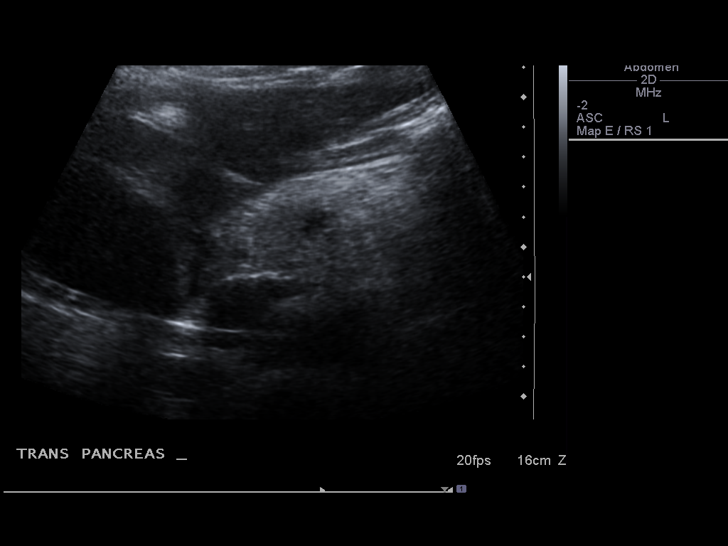
[im 13/78]
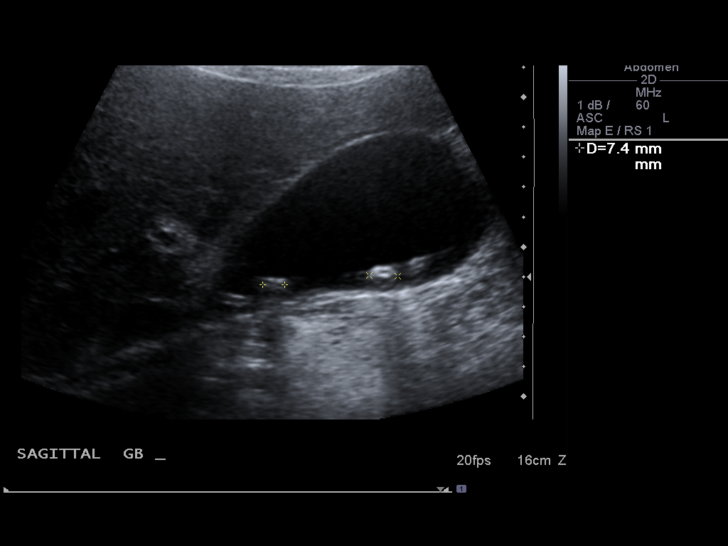
[im 20/78]
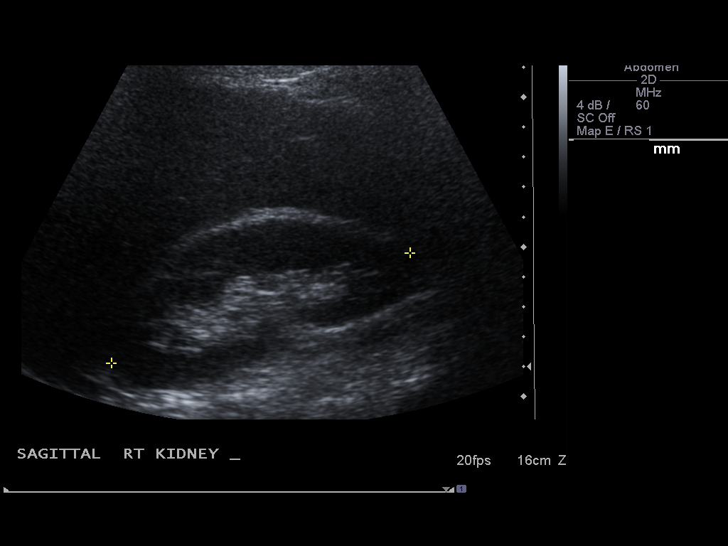
[im 26/78]
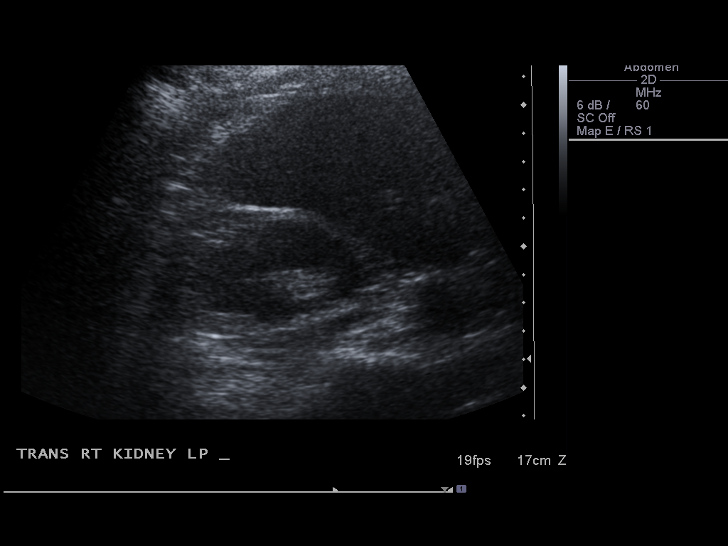
[im 29/78]
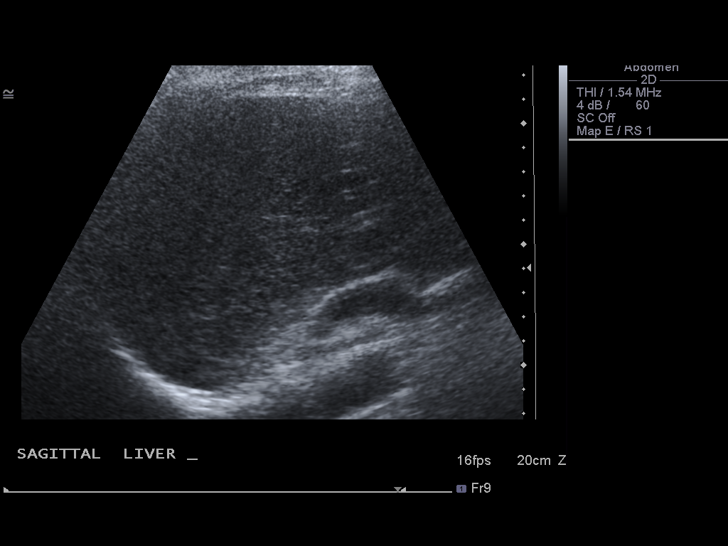
[im 36/78]
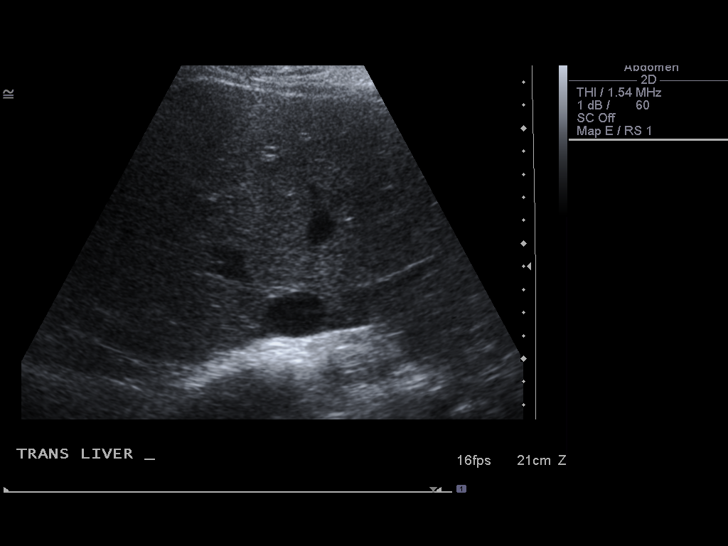
[im 42/78]
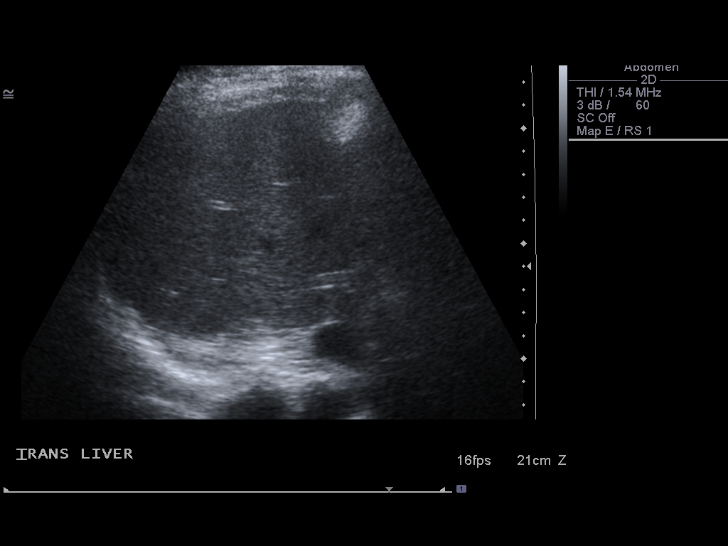
[im 49/78]
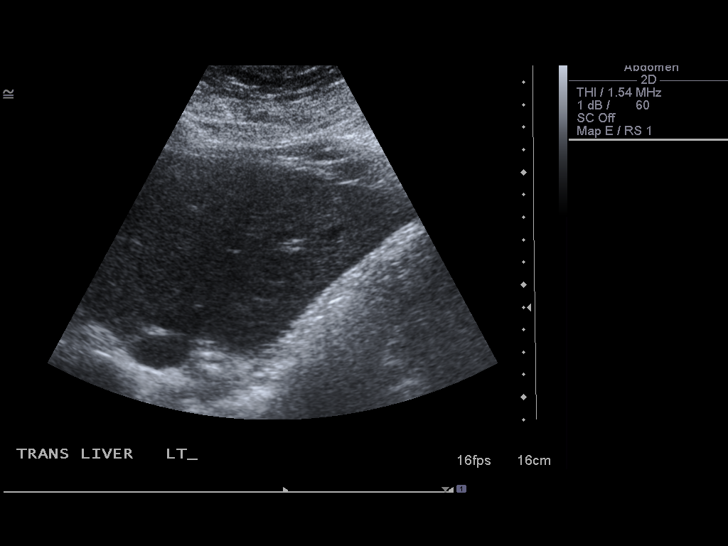
[im 52/78]
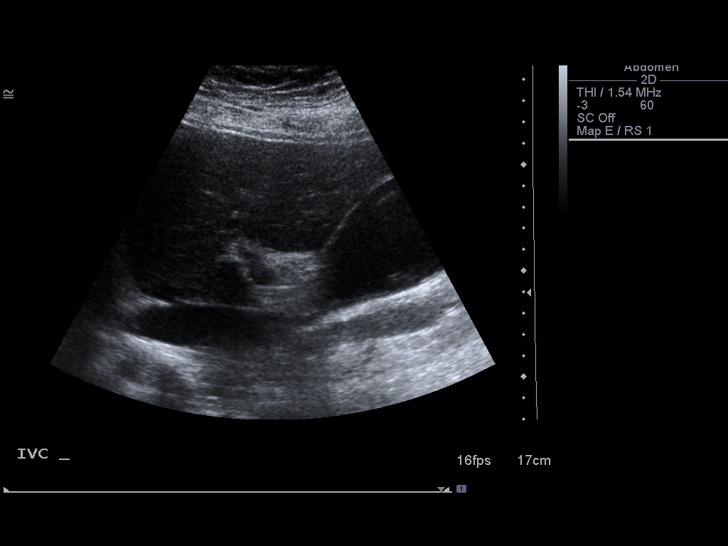
[im 58/78]
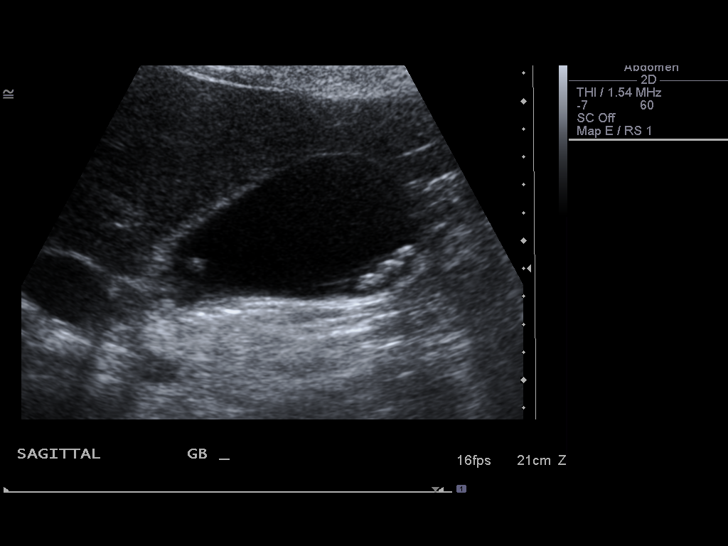
[im 65/78]
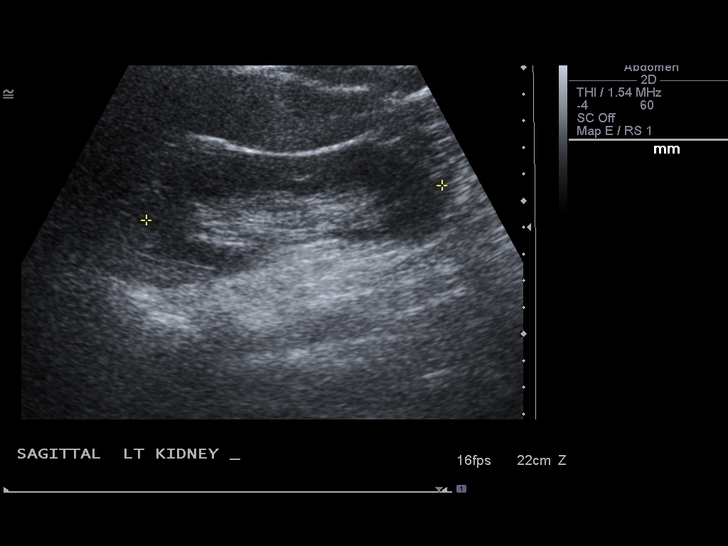
[im 71/78]
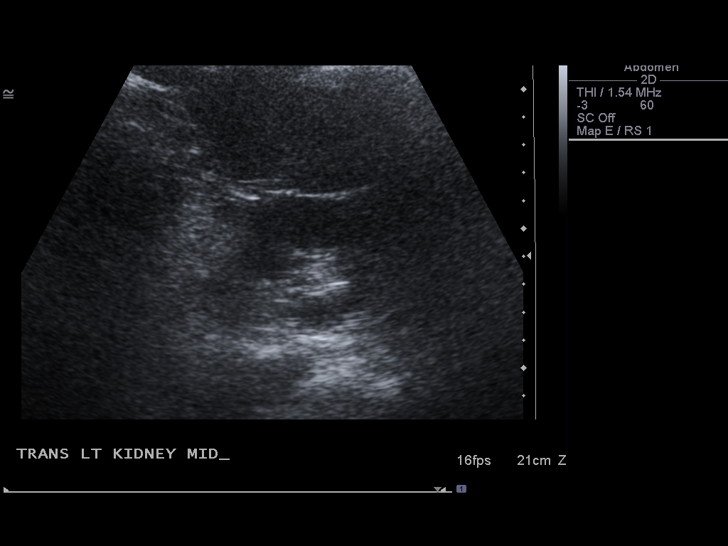
[im 78/78]
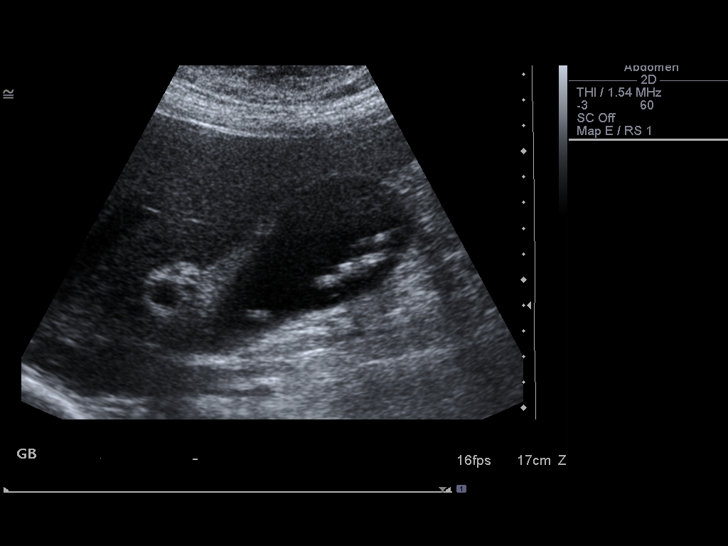

[14 of 25 positions shown; findings below may reference images not displayed]

FINDINGS: Gallbladder:  Multiple gallstones in the gallbladder.  The largest
individual stone measures 10 mm in maximum diameter.  No
gallbladder wall thickening or pericholecystic fluid.  The patient
was not focally tender over the gallbladder.

Common bile duct:  Normal in caliber, measuring 3.6 mm in diameter
proximally.

Liver:  Suboptimally visualized due to patient body habitus.  The
visualized portions appear normal.  The right lobe is elongated
within an appearance suggesting a Riedel's lobe.

IVC:  Normal.

Pancreas:  Poor visualization of the pancreatic tail.  The
visualized portions of the pancreas appear normal.

Spleen:  Normal, measuring 10.4 cm in length.

Right Kidney:  Normal, measuring 10.8 cm in length.

Left Kidney:  Normal, measuring 12.2 cm in length.

Abdominal aorta:  Normal.
IMPRESSION: 1.  Cholelithiasis without evidence of cholecystitis.
2.  Suboptimally visualized liver and pancreatic tail.

## 2013-03-16 IMAGING — CR DG CHEST 2V
2 series · 2 of 2 positions shown · non-contrast
Comparison: None.

CLINICAL DATA: Preoperative respiratory films.  Patient for
cholecystectomy.

CHEST - 2 VIEW

[w chest pa]
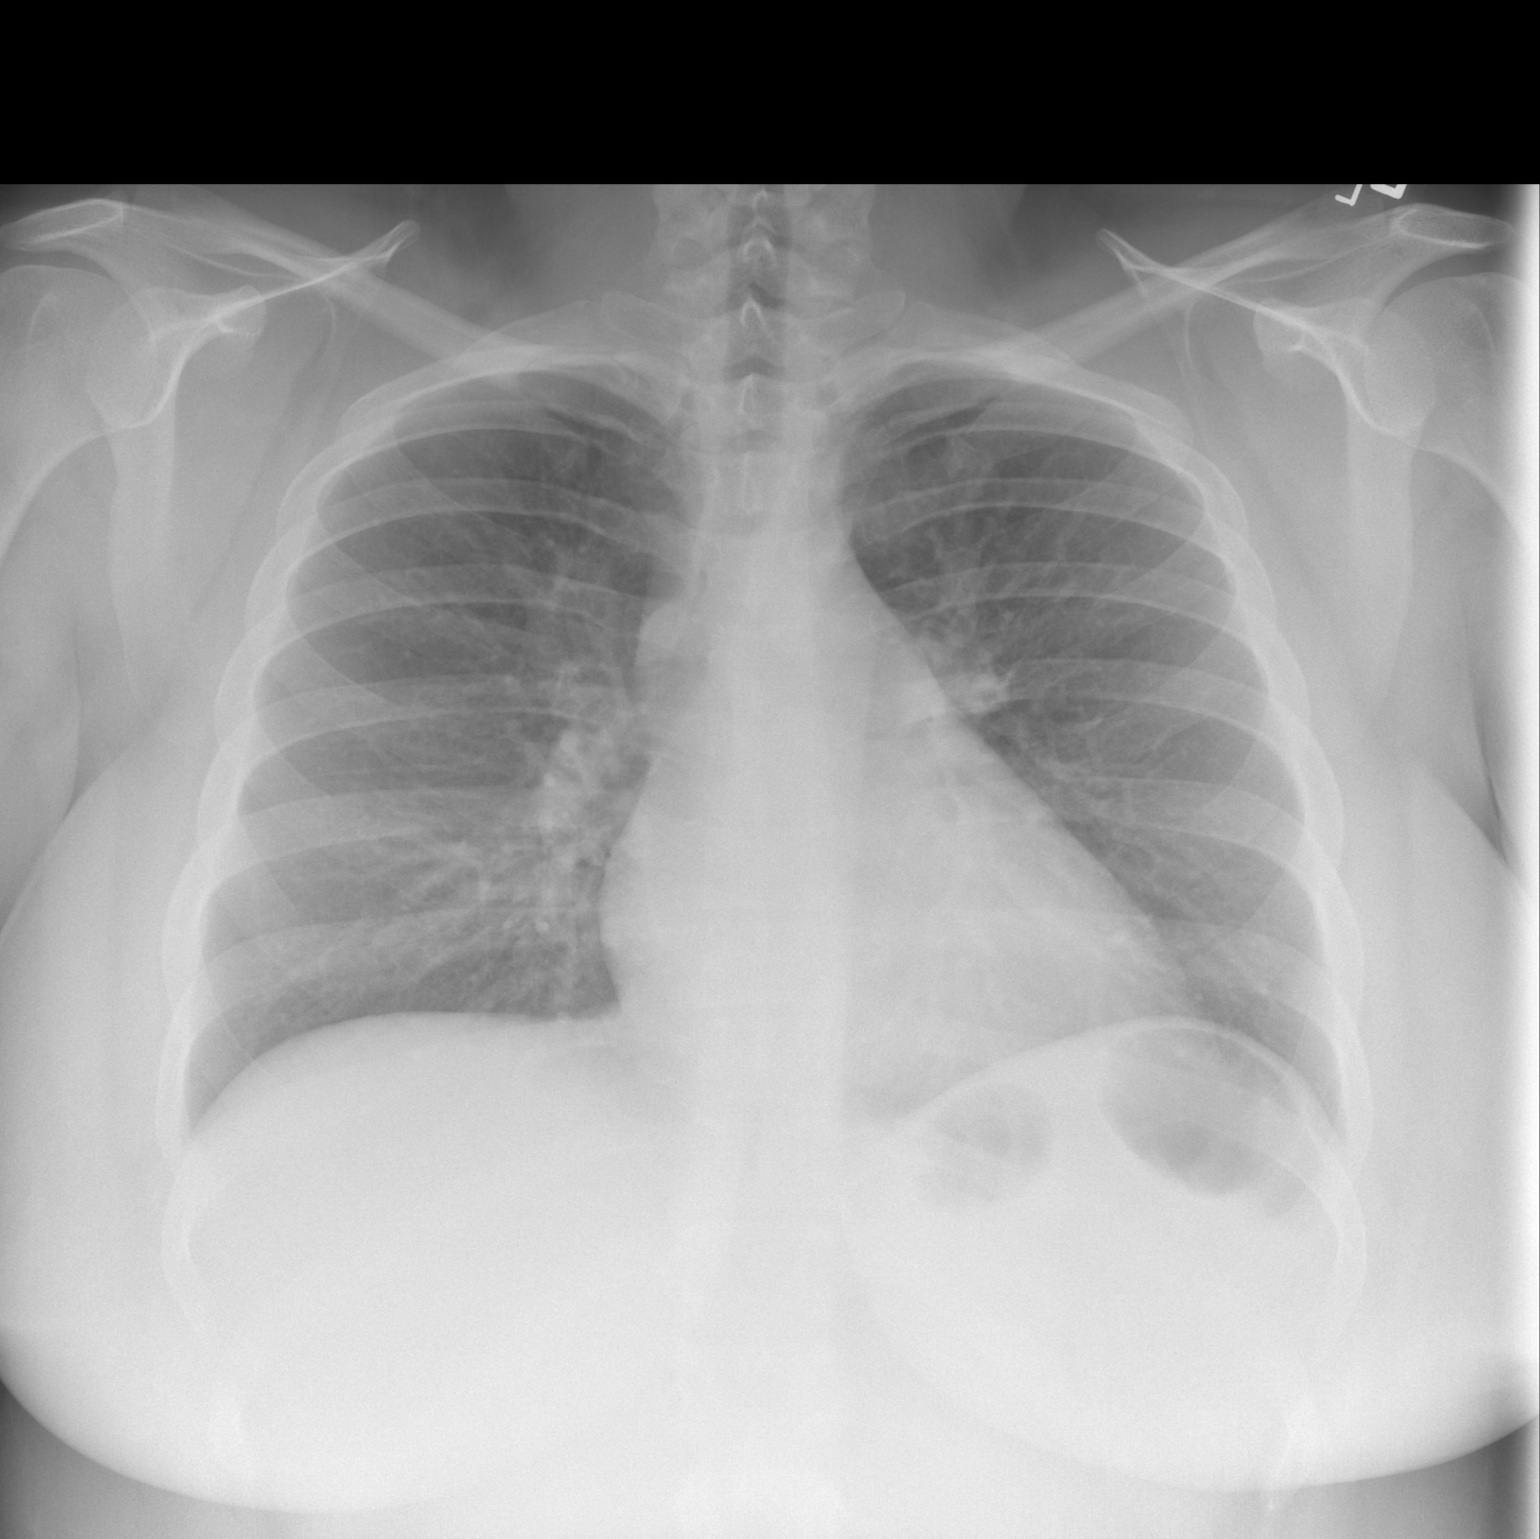

[w chest lat]
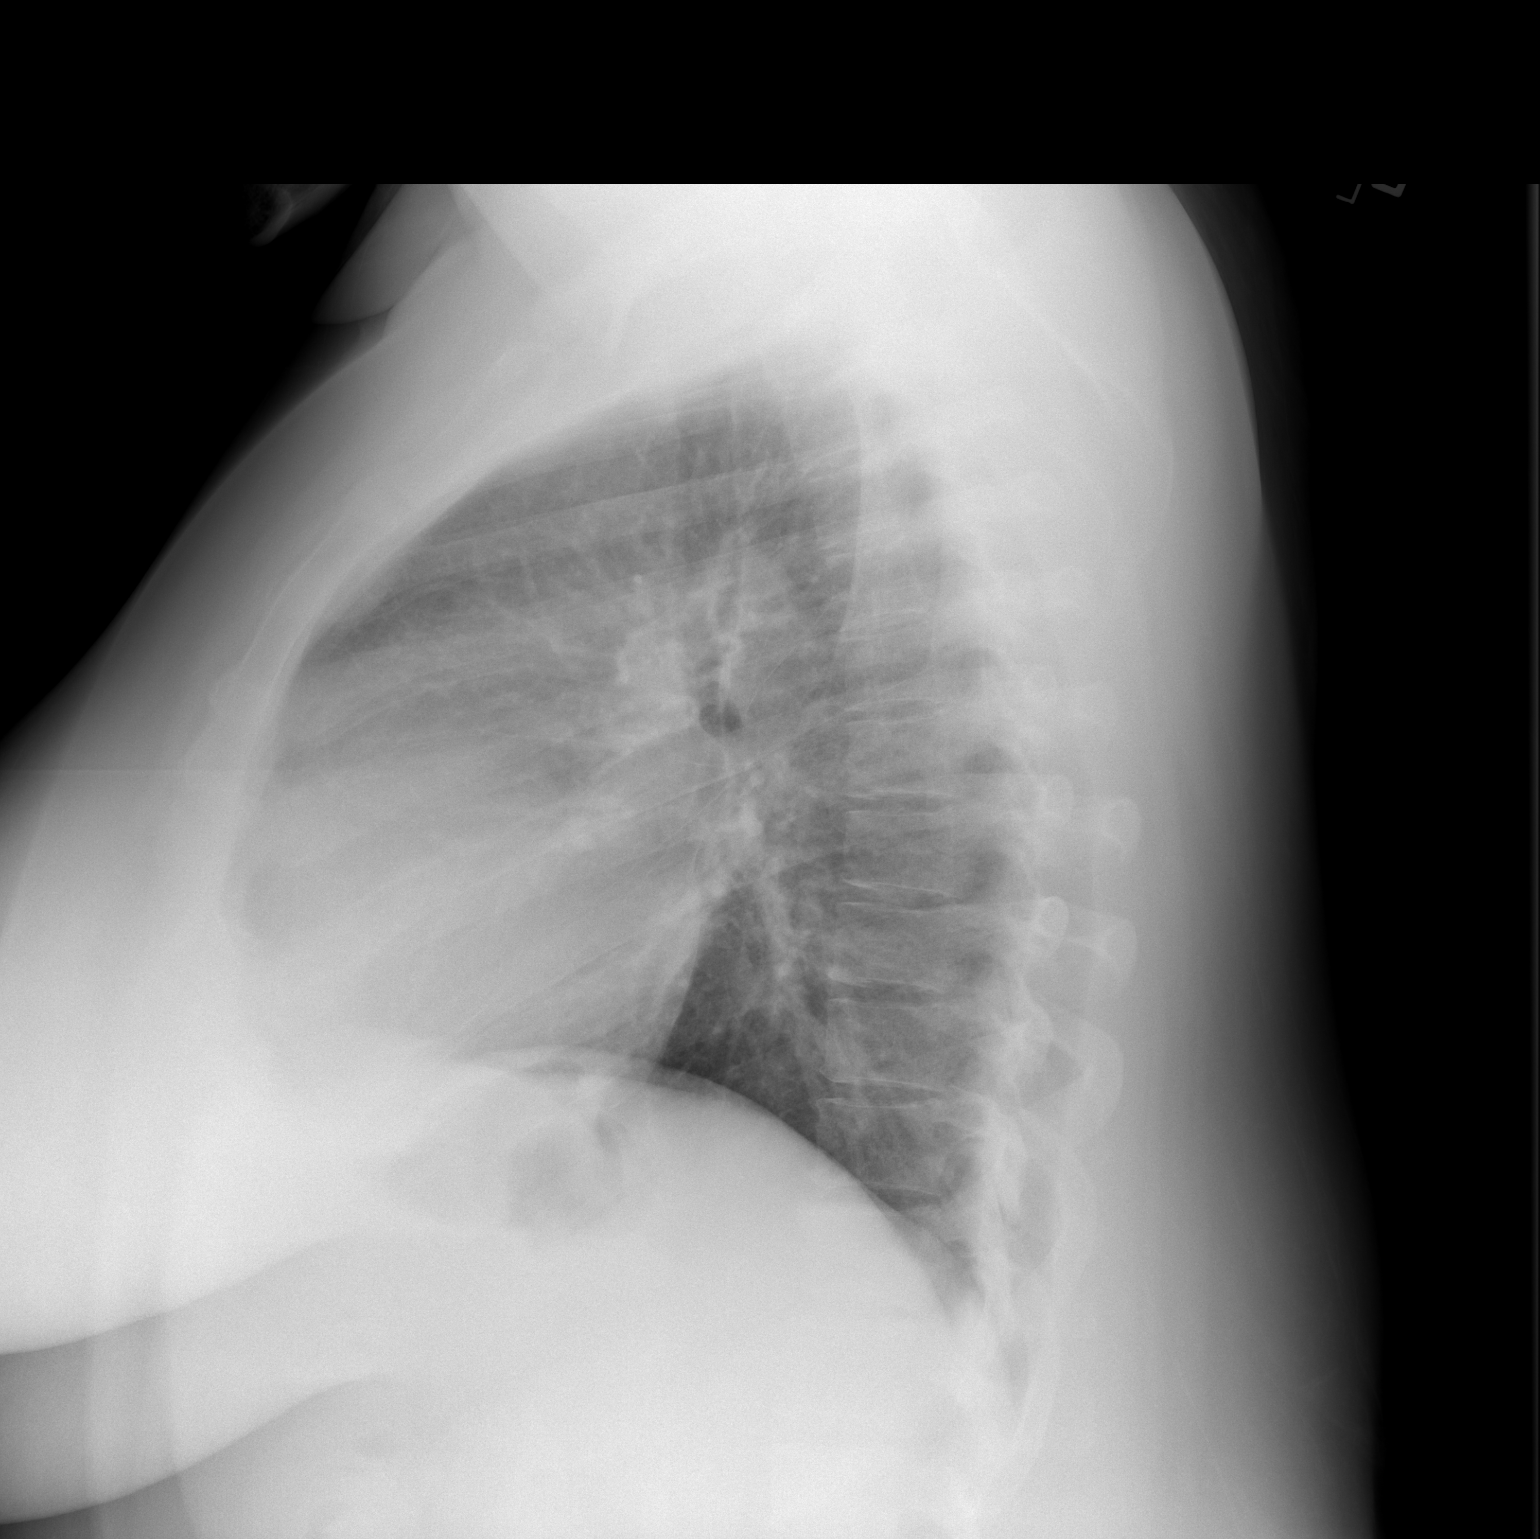

[2 of 2 positions shown; findings below may reference images not displayed]

FINDINGS: Lungs are clear.  Heart size is normal.  No pneumothorax
or effusion.
IMPRESSION: Negative chest.

## 2013-03-21 IMAGING — RF DG CHOLANGIOGRAM OPERATIVE
1 series · 4 of 4 positions shown · non-contrast
Comparison: Ultrasound of 11/26/2010

CLINICAL DATA: Cholelithiasis.

INTRAOPERATIVE CHOLANGIOGRAM
TECHNIQUE: Cholangiographic images from the C-arm fluoroscopic
device were submitted for interpretation post-operatively.  Please
see the procedural report for the amount of contrast and the
fluoroscopy time utilized.

[Series 1: run · 4 of 54 frames shown]
[frame 9/54]
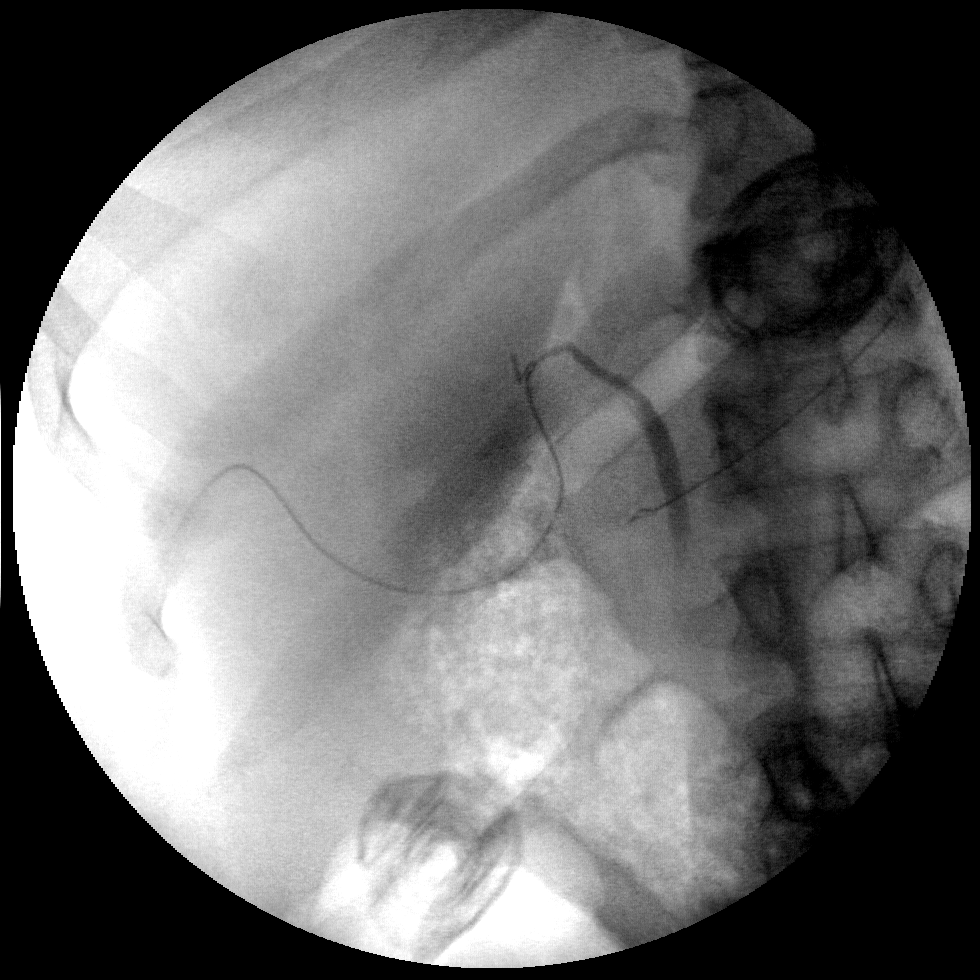
[frame 28/54]
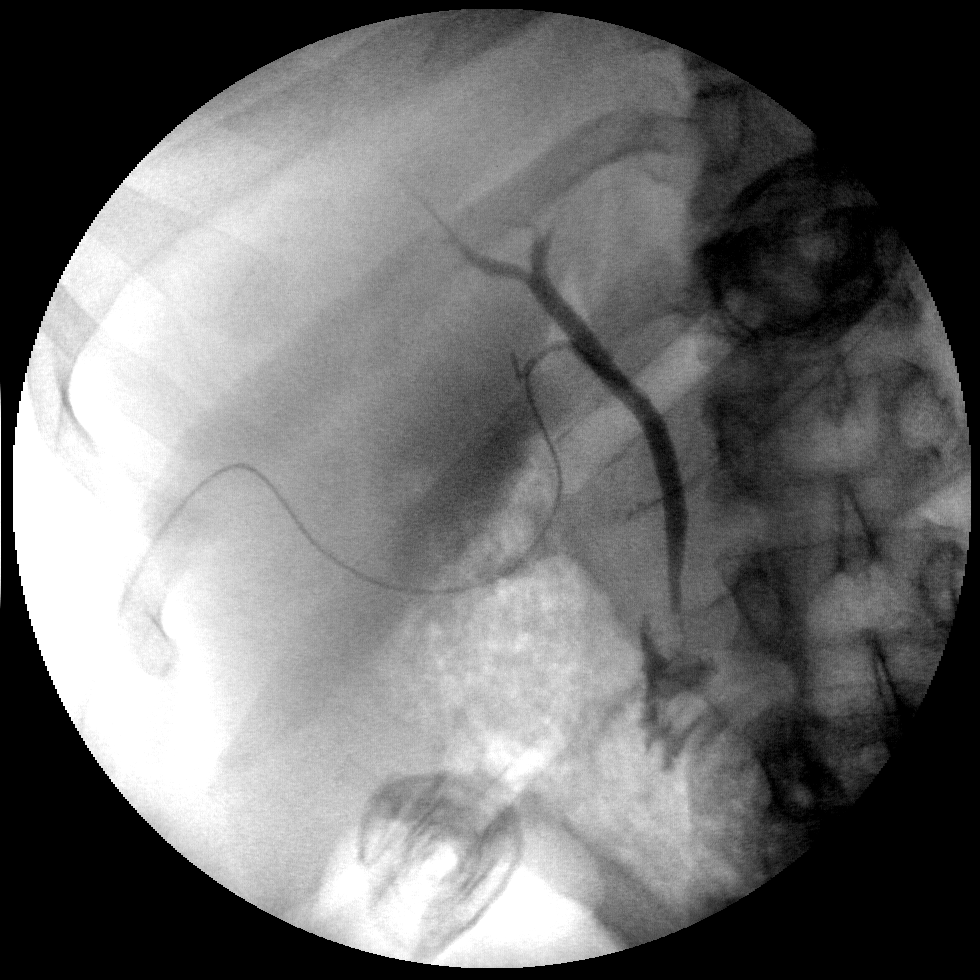
[frame 46/54]
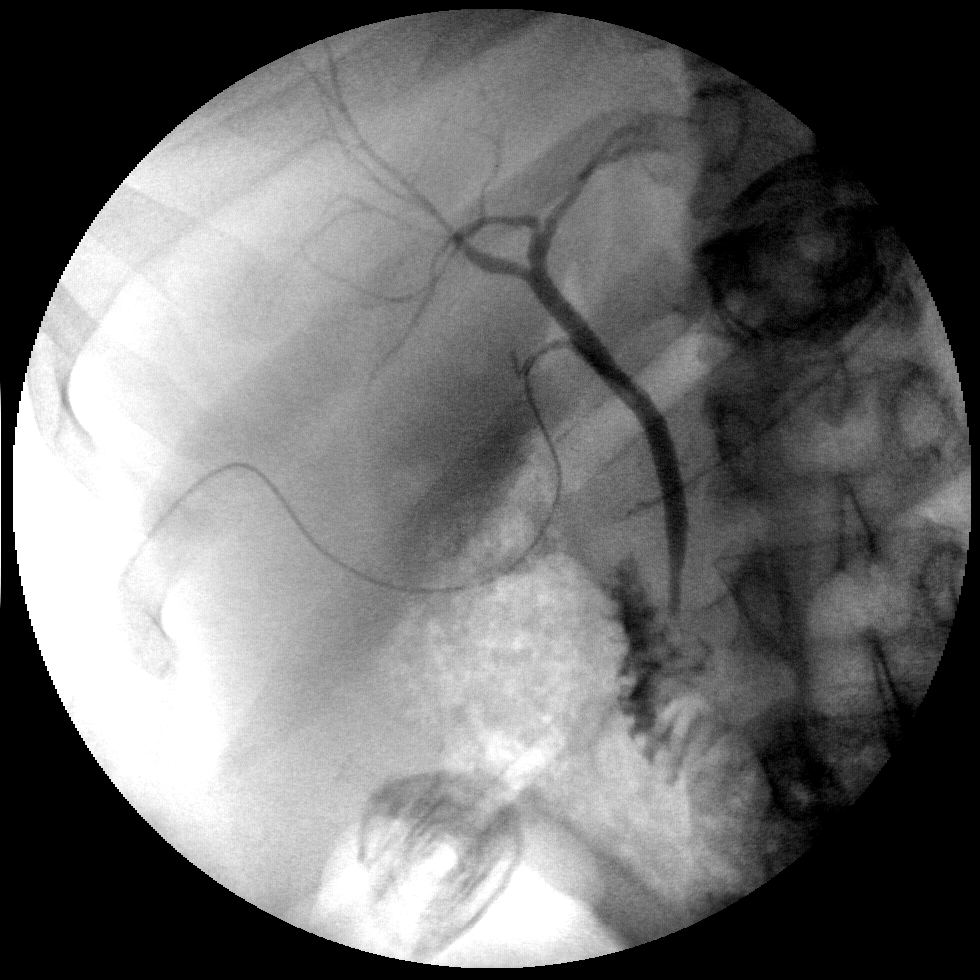
[frame 54/54]
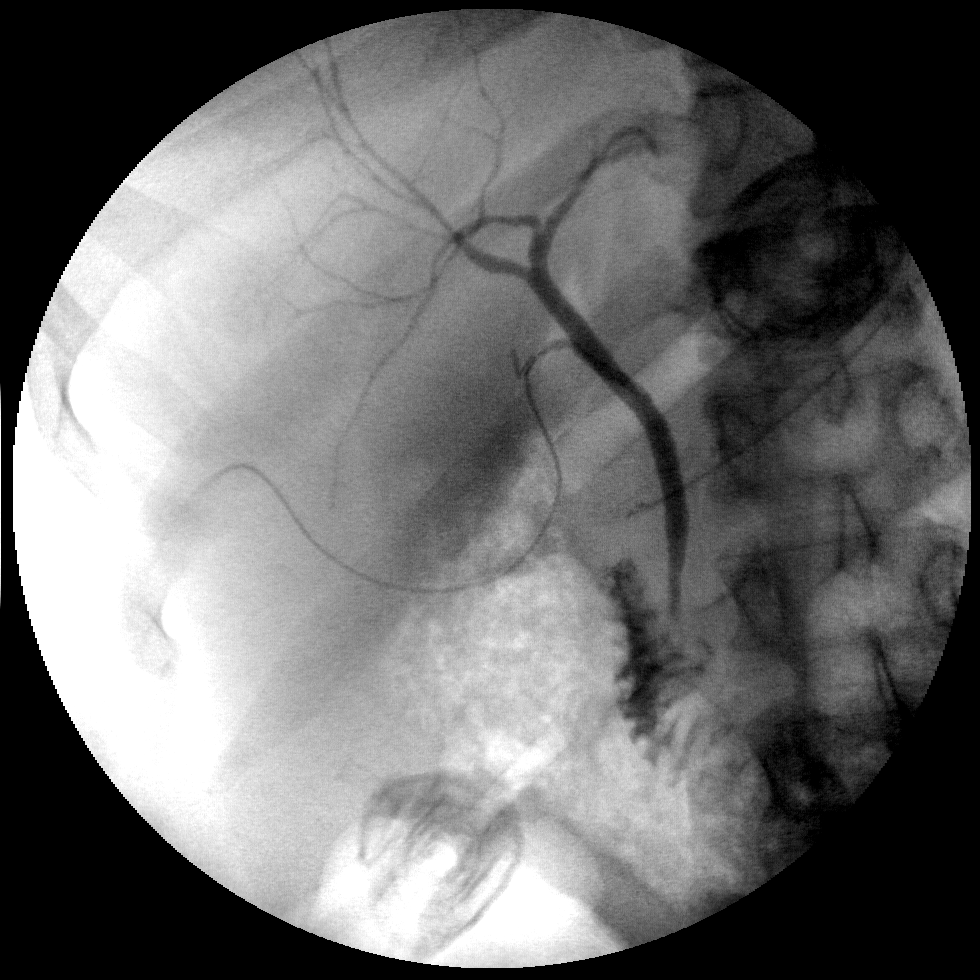

[4 of 4 positions shown; findings below may reference images not displayed]

FINDINGS: Common hepatic and common bile duct are normal with free
flow of contrast into the duodenum.  The visualized intrahepatic
ducts are normal.
IMPRESSION: Normal intraoperative cholangiogram.

## 2016-01-23 DIAGNOSIS — A599 Trichomoniasis, unspecified: Secondary | ICD-10-CM

## 2016-01-23 HISTORY — DX: Trichomoniasis, unspecified: A59.9

## 2016-08-09 DIAGNOSIS — Z3490 Encounter for supervision of normal pregnancy, unspecified, unspecified trimester: Secondary | ICD-10-CM | POA: Insufficient documentation

## 2016-08-09 LAB — OB RESULTS CONSOLE GC/CHLAMYDIA
CHLAMYDIA, DNA PROBE: NEGATIVE
Gonorrhea: NEGATIVE

## 2016-09-13 LAB — OB RESULTS CONSOLE RPR: RPR: NONREACTIVE

## 2016-09-13 LAB — OB RESULTS CONSOLE HEPATITIS B SURFACE ANTIGEN: HEP B S AG: NEGATIVE

## 2016-09-13 LAB — OB RESULTS CONSOLE RUBELLA ANTIBODY, IGM: Rubella: IMMUNE

## 2016-09-13 LAB — OB RESULTS CONSOLE HIV ANTIBODY (ROUTINE TESTING): HIV: NONREACTIVE

## 2016-11-16 ENCOUNTER — Other Ambulatory Visit (HOSPITAL_COMMUNITY): Payer: Self-pay | Admitting: Obstetrics and Gynecology

## 2016-11-16 DIAGNOSIS — E669 Obesity, unspecified: Secondary | ICD-10-CM

## 2016-11-16 DIAGNOSIS — Z3689 Encounter for other specified antenatal screening: Secondary | ICD-10-CM

## 2016-11-16 DIAGNOSIS — Z3A22 22 weeks gestation of pregnancy: Secondary | ICD-10-CM

## 2016-11-21 ENCOUNTER — Encounter (HOSPITAL_COMMUNITY): Payer: Self-pay | Admitting: *Deleted

## 2016-11-22 ENCOUNTER — Ambulatory Visit (HOSPITAL_COMMUNITY)
Admission: RE | Admit: 2016-11-22 | Discharge: 2016-11-22 | Disposition: A | Discharge status: Home / Self Care | Payer: PRIVATE HEALTH INSURANCE | Source: Ambulatory Visit | Attending: Obstetrics and Gynecology | Admitting: Obstetrics and Gynecology

## 2016-11-22 ENCOUNTER — Encounter (HOSPITAL_COMMUNITY): Payer: Self-pay

## 2016-12-20 ENCOUNTER — Encounter (HOSPITAL_COMMUNITY): Payer: Self-pay | Admitting: Obstetrics and Gynecology

## 2016-12-21 ENCOUNTER — Encounter (HOSPITAL_COMMUNITY): Payer: Self-pay

## 2016-12-25 ENCOUNTER — Encounter (HOSPITAL_COMMUNITY): Payer: Self-pay

## 2016-12-25 ENCOUNTER — Other Ambulatory Visit (HOSPITAL_COMMUNITY): Payer: Self-pay | Admitting: Obstetrics and Gynecology

## 2016-12-25 ENCOUNTER — Ambulatory Visit (HOSPITAL_COMMUNITY)
Admission: RE | Admit: 2016-12-25 | Discharge: 2016-12-25 | Disposition: A | Discharge status: Home / Self Care | Payer: BLUE CROSS/BLUE SHIELD | Source: Ambulatory Visit | Attending: Obstetrics and Gynecology | Admitting: Obstetrics and Gynecology

## 2016-12-25 DIAGNOSIS — O10012 Pre-existing essential hypertension complicating pregnancy, second trimester: Secondary | ICD-10-CM | POA: Diagnosis not present

## 2016-12-25 DIAGNOSIS — E669 Obesity, unspecified: Secondary | ICD-10-CM

## 2016-12-25 DIAGNOSIS — O99212 Obesity complicating pregnancy, second trimester: Secondary | ICD-10-CM | POA: Insufficient documentation

## 2016-12-25 DIAGNOSIS — Z3689 Encounter for other specified antenatal screening: Secondary | ICD-10-CM | POA: Diagnosis present

## 2016-12-25 DIAGNOSIS — O09522 Supervision of elderly multigravida, second trimester: Secondary | ICD-10-CM | POA: Diagnosis not present

## 2016-12-25 DIAGNOSIS — O162 Unspecified maternal hypertension, second trimester: Secondary | ICD-10-CM

## 2016-12-25 DIAGNOSIS — Z3A22 22 weeks gestation of pregnancy: Secondary | ICD-10-CM

## 2016-12-25 DIAGNOSIS — Z3A27 27 weeks gestation of pregnancy: Secondary | ICD-10-CM

## 2016-12-25 HISTORY — DX: Chlamydial infection, unspecified: A74.9

## 2016-12-25 HISTORY — DX: Trichomoniasis, unspecified: A59.9

## 2017-02-19 ENCOUNTER — Inpatient Hospital Stay (HOSPITAL_COMMUNITY)
Admission: AD | Admit: 2017-02-19 | Discharge: 2017-02-25 | DRG: 788 | Disposition: A | Discharge status: Home / Self Care | Payer: Medicaid Other | Source: Ambulatory Visit | Attending: Obstetrics and Gynecology | Admitting: Obstetrics and Gynecology

## 2017-02-19 ENCOUNTER — Encounter (HOSPITAL_COMMUNITY): Payer: Self-pay

## 2017-02-19 ENCOUNTER — Other Ambulatory Visit: Payer: Self-pay

## 2017-02-19 DIAGNOSIS — Z3A35 35 weeks gestation of pregnancy: Secondary | ICD-10-CM | POA: Diagnosis not present

## 2017-02-19 DIAGNOSIS — O1002 Pre-existing essential hypertension complicating childbirth: Secondary | ICD-10-CM | POA: Diagnosis present

## 2017-02-19 DIAGNOSIS — Z87891 Personal history of nicotine dependence: Secondary | ICD-10-CM

## 2017-02-19 DIAGNOSIS — O10913 Unspecified pre-existing hypertension complicating pregnancy, third trimester: Secondary | ICD-10-CM | POA: Diagnosis not present

## 2017-02-19 DIAGNOSIS — O403XX0 Polyhydramnios, third trimester, not applicable or unspecified: Secondary | ICD-10-CM | POA: Diagnosis not present

## 2017-02-19 DIAGNOSIS — O10919 Unspecified pre-existing hypertension complicating pregnancy, unspecified trimester: Secondary | ICD-10-CM | POA: Diagnosis present

## 2017-02-19 DIAGNOSIS — O114 Pre-existing hypertension with pre-eclampsia, complicating childbirth: Secondary | ICD-10-CM | POA: Diagnosis not present

## 2017-02-19 DIAGNOSIS — O99824 Streptococcus B carrier state complicating childbirth: Secondary | ICD-10-CM | POA: Diagnosis present

## 2017-02-19 DIAGNOSIS — O99214 Obesity complicating childbirth: Secondary | ICD-10-CM | POA: Diagnosis not present

## 2017-02-19 HISTORY — DX: Essential (primary) hypertension: I10

## 2017-02-19 LAB — COMPREHENSIVE METABOLIC PANEL
ALT: 8 U/L — ABNORMAL LOW (ref 14–54)
AST: 14 U/L — ABNORMAL LOW (ref 15–41)
Albumin: 2.4 g/dL — ABNORMAL LOW (ref 3.5–5.0)
Alkaline Phosphatase: 128 U/L — ABNORMAL HIGH (ref 38–126)
Anion gap: 8 (ref 5–15)
BUN: <5 mg/dL — ABNORMAL LOW (ref 6–20)
CO2: 20 mmol/L — ABNORMAL LOW (ref 22–32)
Calcium: 8.9 mg/dL (ref 8.9–10.3)
Chloride: 106 mmol/L (ref 101–111)
Creatinine, Ser: 0.59 mg/dL (ref 0.44–1.00)
GFR calc Af Amer: >60 mL/min (ref >60)
GFR calc non Af Amer: >60 mL/min (ref >60)
Glucose, Bld: 99 mg/dL (ref 65–99)
Potassium: 3.8 mmol/L (ref 3.5–5.1)
Sodium: 134 mmol/L — ABNORMAL LOW (ref 135–145)
Total Bilirubin: 0.2 mg/dL — ABNORMAL LOW (ref 0.3–1.2)
Total Protein: 6.2 g/dL — ABNORMAL LOW (ref 6.5–8.1)

## 2017-02-19 LAB — CBC
HCT: 33.4 % — ABNORMAL LOW (ref 36.0–46.0)
Hemoglobin: 10.9 g/dL — ABNORMAL LOW (ref 12.0–15.0)
MCH: 26.5 pg (ref 26.0–34.0)
MCHC: 32.6 g/dL (ref 30.0–36.0)
MCV: 81.3 fL (ref 78.0–100.0)
Platelets: 253 10*3/uL (ref 150–400)
RBC: 4.11 MIL/uL (ref 3.87–5.11)
RDW: 15.1 % (ref 11.5–15.5)
WBC: 12.8 10*3/uL — ABNORMAL HIGH (ref 4.0–10.5)

## 2017-02-19 LAB — PROTEIN / CREATININE RATIO, URINE
Creatinine, Urine: 82 mg/dL
Protein Creatinine Ratio: 0.23 mg/mg{Cre} — ABNORMAL HIGH (ref 0.00–0.15)
Total Protein, Urine: 19 mg/dL

## 2017-02-19 LAB — URINALYSIS, ROUTINE W REFLEX MICROSCOPIC
Bilirubin Urine: NEGATIVE
Glucose, UA: NEGATIVE mg/dL
Hgb urine dipstick: NEGATIVE
Ketones, ur: NEGATIVE mg/dL
Leukocytes, UA: NEGATIVE
Nitrite: NEGATIVE
Protein, ur: NEGATIVE mg/dL
Specific Gravity, Urine: 1.008 (ref 1.005–1.030)
pH: 7 (ref 5.0–8.0)

## 2017-02-19 LAB — TYPE AND SCREEN
ABO/RH(D): O POS
Antibody Screen: NEGATIVE

## 2017-02-19 MED ORDER — ZOLPIDEM TARTRATE 5 MG PO TABS
5.0000 mg | ORAL_TABLET | Freq: Every evening | ORAL | Status: DC | PRN
  Administered 2017-02-20 (×2): 5 mg via ORAL
  Filled 2017-02-19 (×2): qty 1

## 2017-02-19 MED ORDER — LACTATED RINGERS IV SOLN
INTRAVENOUS | Status: DC
  Administered 2017-02-20: 950 mL via INTRAVENOUS
  Administered 2017-02-22: 01:00:00 via INTRAVENOUS

## 2017-02-19 MED ORDER — FAMOTIDINE IN NACL 20-0.9 MG/50ML-% IV SOLN
20.0000 mg | Freq: Once | INTRAVENOUS | Status: AC
  Administered 2017-02-20: 20 mg via INTRAVENOUS
  Filled 2017-02-19: qty 50

## 2017-02-19 MED ORDER — BETAMETHASONE SOD PHOS & ACET 6 (3-3) MG/ML IJ SUSP
12.0000 mg | Freq: Once | INTRAMUSCULAR | Status: AC
  Administered 2017-02-20: 12 mg via INTRAMUSCULAR
  Filled 2017-02-19: qty 2

## 2017-02-19 MED ORDER — LACTATED RINGERS IV SOLN: 500.0000 mL | INTRAVENOUS | Status: DC | PRN

## 2017-02-19 MED ORDER — OXYTOCIN 40 UNITS IN LACTATED RINGERS INFUSION - SIMPLE MED: 2.5000 [IU]/h | INTRAVENOUS | Status: DC

## 2017-02-19 MED ORDER — MISOPROSTOL 25 MCG QUARTER TABLET
25.0000 ug | ORAL_TABLET | ORAL | Status: DC | PRN
  Administered 2017-02-19 – 2017-02-20 (×5): 25 ug via VAGINAL
  Filled 2017-02-19 (×5): qty 1

## 2017-02-19 MED ORDER — SOD CITRATE-CITRIC ACID 500-334 MG/5ML PO SOLN
30.0000 mL | ORAL | Status: DC | PRN
  Administered 2017-02-21 – 2017-02-22 (×2): 30 mL via ORAL
  Filled 2017-02-19 (×2): qty 15

## 2017-02-19 MED ORDER — OXYCODONE-ACETAMINOPHEN 5-325 MG PO TABS: 2.0000 | ORAL_TABLET | ORAL | Status: DC | PRN

## 2017-02-19 MED ORDER — MAGNESIUM SULFATE 40 G IN LACTATED RINGERS - SIMPLE
2.0000 g/h | INTRAVENOUS | Status: AC
  Administered 2017-02-19 – 2017-02-22 (×4): 2 g/h via INTRAVENOUS
  Filled 2017-02-19 (×4): qty 40

## 2017-02-19 MED ORDER — MAGNESIUM SULFATE BOLUS VIA INFUSION
4.0000 g | Freq: Once | INTRAVENOUS | Status: AC
  Administered 2017-02-19: 4 g via INTRAVENOUS
  Filled 2017-02-19: qty 500

## 2017-02-19 MED ORDER — OXYCODONE-ACETAMINOPHEN 5-325 MG PO TABS: 1.0000 | ORAL_TABLET | ORAL | Status: DC | PRN

## 2017-02-19 MED ORDER — PROMETHAZINE HCL 25 MG/ML IJ SOLN
25.0000 mg | Freq: Four times a day (QID) | INTRAMUSCULAR | Status: DC | PRN
  Administered 2017-02-20: 25 mg via INTRAVENOUS
  Filled 2017-02-19: qty 1

## 2017-02-19 MED ORDER — PENICILLIN G POTASSIUM 5000000 UNITS IJ SOLR
5.0000 10*6.[IU] | Freq: Once | INTRAVENOUS | Status: DC
  Filled 2017-02-19: qty 5

## 2017-02-19 MED ORDER — PENICILLIN G POTASSIUM 5000000 UNITS IJ SOLR
5.0000 10*6.[IU] | Freq: Once | INTRAVENOUS | Status: DC | PRN
  Filled 2017-02-19: qty 5

## 2017-02-19 MED ORDER — OXYTOCIN BOLUS FROM INFUSION: 500.0000 mL | Freq: Once | INTRAVENOUS | Status: DC

## 2017-02-19 MED ORDER — CALCIUM CARBONATE ANTACID 500 MG PO CHEW: 2.0000 | CHEWABLE_TABLET | ORAL | Status: DC | PRN

## 2017-02-19 MED ORDER — LABETALOL HCL 5 MG/ML IV SOLN
20.0000 mg | INTRAVENOUS | Status: DC | PRN
  Administered 2017-02-20: 20 mg via INTRAVENOUS

## 2017-02-19 MED ORDER — BETAMETHASONE SOD PHOS & ACET 6 (3-3) MG/ML IJ SUSP
12.0000 mg | Freq: Once | INTRAMUSCULAR | Status: AC
  Administered 2017-02-19: 12 mg via INTRAMUSCULAR
  Filled 2017-02-19: qty 2

## 2017-02-19 MED ORDER — PENICILLIN G POT IN DEXTROSE 60000 UNIT/ML IV SOLN
3.0000 10*6.[IU] | INTRAVENOUS | Status: DC
  Filled 2017-02-19 (×2): qty 50

## 2017-02-19 MED ORDER — LABETALOL HCL 5 MG/ML IV SOLN
20.0000 mg | INTRAVENOUS | Status: DC | PRN
  Administered 2017-02-19: 20 mg via INTRAVENOUS
  Filled 2017-02-19: qty 8
  Filled 2017-02-19 (×2): qty 4

## 2017-02-19 MED ORDER — HYDRALAZINE HCL 20 MG/ML IJ SOLN: 10.0000 mg | Freq: Once | INTRAMUSCULAR | Status: DC | PRN

## 2017-02-19 MED ORDER — PRENATAL MULTIVITAMIN CH: 1.0000 | ORAL_TABLET | Freq: Every day | ORAL | Status: DC

## 2017-02-19 MED ORDER — TERBUTALINE SULFATE 1 MG/ML IJ SOLN: 0.2500 mg | Freq: Once | INTRAMUSCULAR | Status: DC | PRN

## 2017-02-19 MED ORDER — LACTATED RINGERS IV SOLN
INTRAVENOUS | Status: DC
  Administered 2017-02-19: 13:00:00 via INTRAVENOUS

## 2017-02-19 MED ORDER — LIDOCAINE HCL (PF) 1 % IJ SOLN
30.0000 mL | INTRAMUSCULAR | Status: DC | PRN
  Filled 2017-02-19: qty 30

## 2017-02-19 MED ORDER — ACETAMINOPHEN 325 MG PO TABS: 650.0000 mg | ORAL_TABLET | ORAL | Status: DC | PRN

## 2017-02-19 MED ORDER — BUTORPHANOL TARTRATE 1 MG/ML IJ SOLN
1.0000 mg | INTRAMUSCULAR | Status: DC | PRN
  Administered 2017-02-20 – 2017-02-21 (×3): 1 mg via INTRAVENOUS
  Filled 2017-02-19 (×3): qty 1

## 2017-02-19 MED ORDER — DOCUSATE SODIUM 100 MG PO CAPS
100.0000 mg | ORAL_CAPSULE | Freq: Every day | ORAL | Status: DC
  Filled 2017-02-19 (×2): qty 1

## 2017-02-19 MED ORDER — PENICILLIN G POT IN DEXTROSE 60000 UNIT/ML IV SOLN
3.0000 10*6.[IU] | INTRAVENOUS | Status: DC | PRN
  Filled 2017-02-19: qty 50

## 2017-02-19 NOTE — Progress Notes (Signed)
TRAnsfered to L&D via  Wheelchair    Report  [facility]  To [facility]

## 2017-02-19 NOTE — Anesthesia Preprocedure Evaluation (Addendum)
Anesthesia Evaluation  Patient identified by MRN, date of birth, ID band Patient awake    Reviewed: Allergy & Precautions, NPO status , Patient's Chart, lab work & pertinent test results  History of Anesthesia Complications (+) MALIGNANT HYPERTHERMIA and history of anesthetic complications  Airway Mallampati: III  TM Distance: >3 FB Neck ROM: Full    Dental  (+) Teeth Intact   Pulmonary former smoker,    Pulmonary exam normal breath sounds clear to auscultation       Cardiovascular hypertension, Pt. on medications Normal cardiovascular exam Rhythm:Regular Rate:Normal     Neuro/Psych PSYCHIATRIC DISORDERS Anxiety Depression negative neurological ROS     GI/Hepatic Neg liver ROS, GERD  Medicated and Controlled,  Endo/Other  Morbid obesitySuper MO.  Renal/GU negative Renal ROS  negative genitourinary   Musculoskeletal negative musculoskeletal ROS (+)   Abdominal (+) + obese,   Peds  Hematology negative hematology ROS (+)   Anesthesia Other Findings   Reproductive/Obstetrics (+) Pregnancy                            Anesthesia Physical Anesthesia Plan  ASA: III and emergent  Anesthesia Plan: Epidural   Post-op Pain Management:    Induction:   PONV Risk Score and Plan: Treatment may vary due to age or medical condition, Scopolamine patch - Pre-op, Dexamethasone and Ondansetron  Airway Management Planned: Natural Airway  Additional Equipment:   Intra-op Plan:   Post-operative Plan:   Informed Consent: I have reviewed the patients History and Physical, chart, labs and discussed the procedure including the risks, benefits and alternatives for the proposed anesthesia with the patient or authorized representative who has indicated his/her understanding and acceptance.   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 She has had surgery since then with GA using non triggering technique. Patient for C/section for arrest of dilation. Will use Epidural for C/Section. M. Malen GauzeFoster, MD)       Anesthesia Quick Evaluation

## 2017-02-19 NOTE — MAU Provider Note (Signed)
Please refer to full NP note below.    This is a 37 y.o. G3P0020 [redacted]w[redacted]d who has a history of CHTN treated with Losartan but was stopped when she found out she was pregnant.  Blood pressures began to become elevated to 140s/80s as early as 26 weeks but did not meet criteria for restarting BP meds.    This week the pt states she has been checking her BPs at home and they were in severe range (180s/90-110s).  Today she c/o of nausea but no other preeclampsia sx.  On exam in office, pt's BP was 160/80 x2;  Protein in urine 1+ (had been trace.)   She was sent to MAU for immediate eval and has BPs requiring Labetalol for control.  Her labs were normal except for Pr/Cr of 0.23.    Her pregnancy is otherwise complicated by AMA, single umbilical artery and poly.  A growth scan today showed EFW 9#12, VTX, AFI 24 (poly) and BPP 8/8.  She has had reassuring ANT since 32 weeks.  I have admitted pt for obs and BMZ shot. After reviewing everything, I have decided to proceed with delivery for [facility] with superimposed severe preeclampsia.   Will give cytotec and second BMZ shot if still pregnant in 12 hours.    Prenatal Transfer Tool  Maternal Diabetes: No- both 1 hour normal and later A1C were completely normal Genetic Screening: Normal- NIPT low risk female Maternal Ultrasounds/Referrals: Abnormal:  Findings:   Other: single umbilical artery Fetal Ultrasounds or other Referrals:  Referred to Materal Fetal Medicine - f/u anatomy Maternal Substance Abuse:  No Significant Maternal Medications:  None Significant Maternal Lab Results: None   Rashawd Laskaris A            Chief Complaint:  Hypertension   First Provider Initiated Contact with Patient 02/19/17 1223      HPI: Anna [name] is a 37 y.o. G3P0020 at [redacted]w[redacted]d who presents to maternity admissions being sent from the office for Hypertension. She is dx with chronic hypertension and here to r/o superimposed preeclamspia. She reports not being on  any medication during pregnancy. She states that she first noticed elevated BP of over 180/100's three days ago- but was not seen due to her having hypertension prior to pregnancy. She was in the office today where her BP was 160/80. She reports increased swelling of ankles and feet. She denies HA, vision changes, epigastric pain. She reports good fetal movement, denies LOF, vaginal bleeding, vaginal itching/burning, urinary symptoms, dizziness, n/v, or fever/chills.    Past Medical History: Past Medical History:  Diagnosis Date  . Anxiety   . Chlamydia   . Depression   . Hypertension   . Malignant hyperthermia    PT STATES DX WITH MALIGNANT HYPERTHERMIA WHEN SHE HAD ADNOIDECTOMY AT AGE 84-DID WEAR ALERT BRACELET AS A CHILD-BUT NO LONGER WEARS.  Marland Kitchen Trichomoniasis 2018    Past obstetric history: OB History  Gravida Para Term Preterm AB Living  3       2 0  SAB TAB Ectopic Multiple Live Births  1 1          # Outcome Date GA Lbr Len/2nd Weight Sex Delivery Anes PTL Lv  3 Current           2 TAB           1 SAB               Past Surgical History: Past Surgical History:  Procedure Laterality  Date  . ADENOIDECTOMY AT AGE 72     surgery never completed-because of malignant hyperthermia complications  . CHOLECYSTECTOMY  05/16/2011   Procedure: LAPAROSCOPIC CHOLECYSTECTOMY WITH INTRAOPERATIVE CHOLANGIOGRAM;  Surgeon: Maisie Fushomas A. Cornett, MD;  Location: [facility];  Service: General;  Laterality: N/A;  Laparoscopic Cholecystectomy and Cholangiogram   . wisdom teeth extracted  2000   pt states surgery was done at [facility]-BECAUSE OF HER PAST HX MALIGNANT HYPERTHERMIA-SURGERY BY DR. Warren DanesARPENTER    Family History: History reviewed. No pertinent family history.  Social History: Social History   Tobacco Use  . Smoking status: Former Smoker    Years: 7.00    Types: Cigars    Last attempt to quit: 07/16/2016    Years since quitting: 0.5  . Smokeless tobacco: Never Used  . Tobacco comment: 7  CIGARS IN A PACK -SMOKES ONE PACK DAILY  Substance Use Topics  . Alcohol use: Yes    Comment: RARELY  . Drug use: No    Allergies:  Allergies  Allergen Reactions  . Anectine [Succinylcholine Chloride] Other (See Comments)    Malignant Hyperthemia     Meds:  Medications Prior to Admission  Medication Sig Dispense Refill Last Dose  . ALPRAZolam (XANAX) 0.5 MG tablet Take 0.5 mg by mouth 3 (three) times daily as needed. Anxiety    Not Taking  . escitalopram (LEXAPRO) 20 MG tablet Take 20 mg by mouth daily before breakfast.    Not Taking  . Ondansetron HCl (ZOFRAN PO) Take by mouth.   Taking  . oxyCODONE-acetaminophen (PERCOCET) 5-325 MG per tablet Take 1 tablet by mouth every 6 (six) hours as needed for pain. (Patient not taking: Reported on 12/25/2016) 20 tablet 0 Not Taking  . Prenatal Vit w/Fe-Methylfol-FA (PNV PO) Take by mouth.   Taking  . promethazine (PHENERGAN) 12.5 MG tablet Take 1 tablet (12.5 mg total) by mouth every 6 (six) hours as needed for nausea. 30 tablet 0   . Promethazine HCl (PHENERGAN PO) Take by mouth.   Taking  . RaNITidine HCl (ZANTAC PO) Take by mouth.   Taking    ROS:  Review of Systems  Constitutional: Negative.   Respiratory: Negative.   Cardiovascular: Negative.   Gastrointestinal: Negative.   Genitourinary: Negative.   Musculoskeletal: Negative.   Neurological: Negative.   Psychiatric/Behavioral: Negative.   Positive for hypertension   I have reviewed patient's Past Medical Hx, Surgical Hx, Family Hx, Social Hx, medications and allergies.   Physical Exam   Patient Vitals for the past 24 hrs:  BP Temp Temp src Pulse Resp SpO2 Weight  02/19/17 1341 113/63 - - 87 - - -  02/19/17 1330 115/61 - - 83 - 98 % -  02/19/17 1320 112/60 - - 83 - - -  02/19/17 1311 120/60 - - 84 - - -  02/19/17 1300 127/71 - - 89 - - -  02/19/17 1252 133/73 - - 92 - - -  02/19/17 1231 (!) 171/96 - - (!) 115 - - -  02/19/17 1216 (!) 163/85 - - (!) 110 - - -   02/19/17 1215 (!) 180/89 - - (!) 110 - - -  02/19/17 1202 (!) 168/90 98.5 F (36.9 C) Oral (!) 116 18 97 % (!) 362 lb (164.2 kg)   Constitutional: Well-developed, morbid obese female in no acute distress.  Cardiovascular: normal rate Respiratory: normal effort GI: Abd soft, non-tender, gravid appropriate for gestational age. Fetus measuring 9-12oz on last US.  MS: Extremities nontender, +2 pitting  edema in ankles, normal ROM, DTR +1, no clonus  Neurologic: Alert and oriented x 4.  GU: Neg CVAT.  Cervical exam: deferred, patient states she just had her cervix checked in the office. Reports it being "high/thick".   FHT:  Baseline 145 , moderate variability, accelerations present, no decelerations Contractions: none   Labs: Results for orders placed or performed during the hospital encounter of 02/19/17 (from the past 24 hour(s))  Protein / creatinine ratio, urine     Status: Abnormal   Collection Time: 02/19/17 11:57 AM  Result Value Ref Range   Creatinine, Urine 82.00 mg/dL   Total Protein, Urine 19 mg/dL   Protein Creatinine Ratio 0.23 (H) 0.00 - 0.15 mg/mg[Cre]  Urinalysis, Routine w reflex microscopic     Status: Abnormal   Collection Time: 02/19/17 11:57 AM  Result Value Ref Range   Color, Urine YELLOW YELLOW   APPearance HAZY (A) CLEAR   Specific Gravity, Urine 1.008 1.005 - 1.030   pH 7.0 5.0 - 8.0   Glucose, UA NEGATIVE NEGATIVE mg/dL   Hgb urine dipstick NEGATIVE NEGATIVE   Bilirubin Urine NEGATIVE NEGATIVE   Ketones, ur NEGATIVE NEGATIVE mg/dL   Protein, ur NEGATIVE NEGATIVE mg/dL   Nitrite NEGATIVE NEGATIVE   Leukocytes, UA NEGATIVE NEGATIVE  CBC     Status: Abnormal   Collection Time: 02/19/17 12:40 PM  Result Value Ref Range   WBC 12.8 (H) 4.0 - 10.5 K/uL   RBC 4.11 3.87 - 5.11 MIL/uL   Hemoglobin 10.9 (L) 12.0 - 15.0 g/dL   HCT 16.1 (L) 09.6 - 04.5 %   MCV 81.3 78.0 - 100.0 fL   MCH 26.5 26.0 - 34.0 pg   MCHC 32.6 30.0 - 36.0 g/dL   RDW 40.9 81.1 - 91.4  %   Platelets 253 150 - 400 K/uL  Comprehensive metabolic panel     Status: Abnormal   Collection Time: 02/19/17 12:40 PM  Result Value Ref Range   Sodium 134 (L) 135 - 145 mmol/L   Potassium 3.8 3.5 - 5.1 mmol/L   Chloride 106 101 - 111 mmol/L   CO2 20 (L) 22 - 32 mmol/L   Glucose, Bld 99 65 - 99 mg/dL   BUN <5 (L) 6 - 20 mg/dL   Creatinine, Ser 7.82 0.44 - 1.00 mg/dL   Calcium 8.9 8.9 - 95.6 mg/dL   Total Protein 6.2 (L) 6.5 - 8.1 g/dL   Albumin 2.4 (L) 3.5 - 5.0 g/dL   AST 14 (L) 15 - 41 U/L   ALT 8 (L) 14 - 54 U/L   Alkaline Phosphatase 128 (H) 38 - 126 U/L   Total Bilirubin 0.2 (L) 0.3 - 1.2 mg/dL   GFR calc non Af Amer >60 >60 mL/min   GFR calc Af Amer >60 >60 mL/min   Anion gap 8 5 - 15    MAU Course/MDM: Orders Placed This Encounter  Procedures  . CBC  . Comprehensive metabolic panel  . Protein / creatinine ratio, urine  . Urinalysis, Routine w reflex microscopic  . CBC  . Comprehensive metabolic panel  . Protein / creatinine ratio, urine  . Diet regular Room service appropriate? Yes; Fluid consistency: Thin  . Check blood pressure 20 minutes after giving hydrALAZINE 10 mg IV dose. Call MD if SBP >/= 160 and/or DBP >/= 110.  . Once BP goal is reached, repeat BP every 10 minutes for 1 hour, then every 15 minutes for 1 hours, then per policy for antepartum  labor or post-partum.  Marland Kitchen Notify Physician  . Notify physician (specify)  . Vital signs  . Defer vaginal exam for vaginal bleeding or PROM <37 weeks  . SCDs  . Fetal monitoring  . Continuous tocometry  . Full code  . Type and screen [facility]  . Insert peripheral IV  . Place in observation (patient's expected length of stay will be less than 2 midnights)  PreE labs- normal  PCR- elevated to 0.23  Meds ordered this encounter  Medications  . labetalol (NORMODYNE,TRANDATE) injection 20-80 mg  . hydrALAZINE (APRESOLINE) injection 10 mg  . lactated ringers infusion  . betamethasone  acetate-betamethasone sodium phosphate (CELESTONE) injection 12 mg  . acetaminophen (TYLENOL) tablet 650 mg  . zolpidem (AMBIEN) tablet 5 mg  . docusate sodium (COLACE) capsule 100 mg  . calcium carbonate (TUMS - dosed in mg elemental calcium) chewable tablet 400 mg of elemental calcium  . prenatal multivitamin tablet 1 tablet  . betamethasone acetate-betamethasone sodium phosphate (CELESTONE) injection 12 mg   NST reviewed- reactive  Consult Dr Henderson Cloud with exam findings and test results. Recommends admission to antenatal for observation. Repeat labs tomorrow with second BMZ shot.  Treatments in MAU included 20 mg Labetalol with LR at IV- hypertension resolved with one dose of medication.     Assessment: 1. Chronic hypertension during pregnancy, antepartum    Plan: Admit to Antenatal for observation  Care transferred to Dr Henderson Cloud for management  Orders placed for observation    Anna [name] Certified Nurse-Midwife 02/19/2017 2:45 PM

## 2017-02-19 NOTE — MAU Note (Signed)
Sent from office for further evaluation of elevated blood pressure.  Denies headache, changes in vision, epigastric pain, or increase in swelling.

## 2017-02-19 NOTE — Anesthesia Pain Management Evaluation Note (Signed)
  CRNA Pain Management Visit Note  Patient: Anna [name], 37 y.o., female  "Hello I am a [facility]. We have an anesthesia team available at all times to provide care throughout the hospital, including epidural management and anesthesia for C-section. I don't know your plan for the delivery whether it a natural birth, water birth, IV sedation, nitrous supplementation, doula or epidural, but we want to meet your pain goals."   1.Was your pain managed to your expectations on prior hospitalizations?   Yes   2.What is your expectation for pain management during this hospitalization?     Epidural  3.How can we help you reach that goal? epidural  Record the patient's initial score and the patient's pain goal.   Pain: 2  Pain Goal: 5 [facility] wants you to be able to say your pain was always managed very well.  Anna [name] 02/19/2017

## 2017-02-19 NOTE — MAU Provider Note (Signed)
Chief Complaint:  Hypertension   First Provider Initiated Contact with Patient 02/19/17 1223      HPI: Anna [name] is a 37 y.o. G3P0020 at [redacted]w[redacted]d who presents to maternity admissions being sent from the office for Hypertension. She is dx with chronic hypertension and here to r/o superimposed preeclamspia. She reports not being on any medication during pregnancy. She states that she first noticed elevated BP of over 180/100's three days ago- but was not seen due to her having hypertension prior to pregnancy. She was in the office today where her BP was 160/80. She reports increased swelling of ankles and feet. She denies HA, vision changes, epigastric pain. She reports good fetal movement, denies LOF, vaginal bleeding, vaginal itching/burning, urinary symptoms, dizziness, n/v, or fever/chills.    Past Medical History: Past Medical History:  Diagnosis Date  . Anxiety   . Chlamydia   . Depression   . Hypertension   . Malignant hyperthermia    PT STATES DX WITH MALIGNANT HYPERTHERMIA WHEN SHE HAD ADNOIDECTOMY AT AGE 84-DID WEAR ALERT BRACELET AS A CHILD-BUT NO LONGER WEARS.  Marland Kitchen Trichomoniasis 2018    Past obstetric history: OB History  Gravida Para Term Preterm AB Living  3       2 0  SAB TAB Ectopic Multiple Live Births  1 1          # Outcome Date GA Lbr Len/2nd Weight Sex Delivery Anes PTL Lv  3 Current           2 TAB           1 SAB               Past Surgical History: Past Surgical History:  Procedure Laterality Date  . ADENOIDECTOMY AT AGE 84     surgery never completed-because of malignant hyperthermia complications  . CHOLECYSTECTOMY  05/16/2011   Procedure: LAPAROSCOPIC CHOLECYSTECTOMY WITH INTRAOPERATIVE CHOLANGIOGRAM;  Surgeon: Maisie Fus A. Cornett, MD;  Location: [facility];  Service: General;  Laterality: N/A;  Laparoscopic Cholecystectomy and Cholangiogram   . wisdom teeth extracted  2000   pt states surgery was done at [facility]-BECAUSE OF HER PAST HX MALIGNANT  HYPERTHERMIA-SURGERY BY DR. Warren Danes    Family History: History reviewed. No pertinent family history.  Social History: Social History   Tobacco Use  . Smoking status: Former Smoker    Years: 7.00    Types: Cigars    Last attempt to quit: 07/16/2016    Years since quitting: 0.5  . Smokeless tobacco: Never Used  . Tobacco comment: 7 CIGARS IN A PACK -SMOKES ONE PACK DAILY  Substance Use Topics  . Alcohol use: Yes    Comment: RARELY  . Drug use: No    Allergies:  Allergies  Allergen Reactions  . Anectine [Succinylcholine Chloride] Other (See Comments)    Malignant Hyperthemia     Meds:  Medications Prior to Admission  Medication Sig Dispense Refill Last Dose  . ALPRAZolam (XANAX) 0.5 MG tablet Take 0.5 mg by mouth 3 (three) times daily as needed. Anxiety    Not Taking  . escitalopram (LEXAPRO) 20 MG tablet Take 20 mg by mouth daily before breakfast.    Not Taking  . Ondansetron HCl (ZOFRAN PO) Take by mouth.   Taking  . oxyCODONE-acetaminophen (PERCOCET) 5-325 MG per tablet Take 1 tablet by mouth every 6 (six) hours as needed for pain. (Patient not taking: Reported on 12/25/2016) 20 tablet 0 Not Taking  . Prenatal Vit w/Fe-Methylfol-FA (PNV  PO) Take by mouth.   Taking  . promethazine (PHENERGAN) 12.5 MG tablet Take 1 tablet (12.5 mg total) by mouth every 6 (six) hours as needed for nausea. 30 tablet 0   . Promethazine HCl (PHENERGAN PO) Take by mouth.   Taking  . RaNITidine HCl (ZANTAC PO) Take by mouth.   Taking    ROS:  Review of Systems  Constitutional: Negative.   Respiratory: Negative.   Cardiovascular: Negative.   Gastrointestinal: Negative.   Genitourinary: Negative.   Musculoskeletal: Negative.   Neurological: Negative.   Psychiatric/Behavioral: Negative.   Positive for hypertension   I have reviewed patient's Past Medical Hx, Surgical Hx, Family Hx, Social Hx, medications and allergies.   Physical Exam   Patient Vitals for the past 24 hrs:  BP Temp  Temp src Pulse Resp SpO2 Weight  02/19/17 1341 113/63 - - 87 - - -  02/19/17 1330 115/61 - - 83 - 98 % -  02/19/17 1320 112/60 - - 83 - - -  02/19/17 1311 120/60 - - 84 - - -  02/19/17 1300 127/71 - - 89 - - -  02/19/17 1252 133/73 - - 92 - - -  02/19/17 1231 (!) 171/96 - - (!) 115 - - -  02/19/17 1216 (!) 163/85 - - (!) 110 - - -  02/19/17 1215 (!) 180/89 - - (!) 110 - - -  02/19/17 1202 (!) 168/90 98.5 F (36.9 C) Oral (!) 116 18 97 % (!) 362 lb (164.2 kg)   Constitutional: Well-developed, morbid obese female in no acute distress.  Cardiovascular: normal rate Respiratory: normal effort GI: Abd soft, non-tender, gravid appropriate for gestational age. Fetus measuring 9-12oz on last US.  MS: Extremities nontender, +2 pitting edema in ankles, normal ROM, DTR +1, no clonus  Neurologic: Alert and oriented x 4.  GU: Neg CVAT.  Cervical exam: deferred, patient states she just had her cervix checked in the office. Reports it being "high/thick".   FHT:  Baseline 145 , moderate variability, accelerations present, no decelerations Contractions: none   Labs: Results for orders placed or performed during the hospital encounter of 02/19/17 (from the past 24 hour(s))  Protein / creatinine ratio, urine     Status: Abnormal   Collection Time: 02/19/17 11:57 AM  Result Value Ref Range   Creatinine, Urine 82.00 mg/dL   Total Protein, Urine 19 mg/dL   Protein Creatinine Ratio 0.23 (H) 0.00 - 0.15 mg/mg[Cre]  Urinalysis, Routine w reflex microscopic     Status: Abnormal   Collection Time: 02/19/17 11:57 AM  Result Value Ref Range   Color, Urine YELLOW YELLOW   APPearance HAZY (A) CLEAR   Specific Gravity, Urine 1.008 1.005 - 1.030   pH 7.0 5.0 - 8.0   Glucose, UA NEGATIVE NEGATIVE mg/dL   Hgb urine dipstick NEGATIVE NEGATIVE   Bilirubin Urine NEGATIVE NEGATIVE   Ketones, ur NEGATIVE NEGATIVE mg/dL   Protein, ur NEGATIVE NEGATIVE mg/dL   Nitrite NEGATIVE NEGATIVE   Leukocytes, UA NEGATIVE  NEGATIVE  CBC     Status: Abnormal   Collection Time: 02/19/17 12:40 PM  Result Value Ref Range   WBC 12.8 (H) 4.0 - 10.5 K/uL   RBC 4.11 3.87 - 5.11 MIL/uL   Hemoglobin 10.9 (L) 12.0 - 15.0 g/dL   HCT 16.133.4 (L) 09.636.0 - 04.546.0 %   MCV 81.3 78.0 - 100.0 fL   MCH 26.5 26.0 - 34.0 pg   MCHC 32.6 30.0 - 36.0 g/dL  RDW 15.1 11.5 - 15.5 %   Platelets 253 150 - 400 K/uL  Comprehensive metabolic panel     Status: Abnormal   Collection Time: 02/19/17 12:40 PM  Result Value Ref Range   Sodium 134 (L) 135 - 145 mmol/L   Potassium 3.8 3.5 - 5.1 mmol/L   Chloride 106 101 - 111 mmol/L   CO2 20 (L) 22 - 32 mmol/L   Glucose, Bld 99 65 - 99 mg/dL   BUN <5 (L) 6 - 20 mg/dL   Creatinine, Ser 4.09 0.44 - 1.00 mg/dL   Calcium 8.9 8.9 - 81.1 mg/dL   Total Protein 6.2 (L) 6.5 - 8.1 g/dL   Albumin 2.4 (L) 3.5 - 5.0 g/dL   AST 14 (L) 15 - 41 U/L   ALT 8 (L) 14 - 54 U/L   Alkaline Phosphatase 128 (H) 38 - 126 U/L   Total Bilirubin 0.2 (L) 0.3 - 1.2 mg/dL   GFR calc non Af Amer >60 >60 mL/min   GFR calc Af Amer >60 >60 mL/min   Anion gap 8 5 - 15    MAU Course/MDM: Orders Placed This Encounter  Procedures  . CBC  . Comprehensive metabolic panel  . Protein / creatinine ratio, urine  . Urinalysis, Routine w reflex microscopic  . Check blood pressure 20 minutes after giving hydrALAZINE 10 mg IV dose. Call MD if SBP >/= 160 and/or DBP >/= 110.  . Once BP goal is reached, repeat BP every 10 minutes for 1 hour, then every 15 minutes for 1 hours, then per policy for antepartum labor or post-partum.  . Vital signs  . Notify Physician  . Insert peripheral IV  PreE labs- normal  PCR- elevated to 0.23  Meds ordered this encounter  Medications  . labetalol (NORMODYNE,TRANDATE) injection 20-80 mg  . hydrALAZINE (APRESOLINE) injection 10 mg  . lactated ringers infusion  . betamethasone acetate-betamethasone sodium phosphate (CELESTONE) injection 12 mg   NST reviewed- reactive  Consult Dr Henderson Cloud with  exam findings and test results. Recommends admission to antenatal for observation. Repeat labs tomorrow with second BMZ shot.  Treatments in MAU included 20 mg Labetalol with LR at IV- hypertension resolved with one dose of medication.     Assessment: 1. Chronic hypertension during pregnancy, antepartum    Plan: Admit to Antenatal for observation  Care transferred to Dr Henderson Cloud for management  Orders placed for observation    Steward Drone Certified Nurse-Midwife 02/19/2017 2:33 PM

## 2017-02-20 LAB — COMPREHENSIVE METABOLIC PANEL
ALT: 12 U/L — ABNORMAL LOW (ref 14–54)
AST: 23 U/L (ref 15–41)
Albumin: 2.7 g/dL — ABNORMAL LOW (ref 3.5–5.0)
Alkaline Phosphatase: 131 U/L — ABNORMAL HIGH (ref 38–126)
Anion gap: 10 (ref 5–15)
BILIRUBIN TOTAL: 0.5 mg/dL (ref 0.3–1.2)
BUN: <5 mg/dL — ABNORMAL LOW (ref 6–20)
CO2: 21 mmol/L — ABNORMAL LOW (ref 22–32)
Calcium: 8.8 mg/dL — ABNORMAL LOW (ref 8.9–10.3)
Chloride: 105 mmol/L (ref 101–111)
Creatinine, Ser: 0.75 mg/dL (ref 0.44–1.00)
Glucose, Bld: 144 mg/dL — ABNORMAL HIGH (ref 65–99)
POTASSIUM: 4.3 mmol/L (ref 3.5–5.1)
Sodium: 136 mmol/L (ref 135–145)
TOTAL PROTEIN: 7.3 g/dL (ref 6.5–8.1)

## 2017-02-20 LAB — CBC
HEMATOCRIT: 34.6 % — AB (ref 36.0–46.0)
Hemoglobin: 11.3 g/dL — ABNORMAL LOW (ref 12.0–15.0)
MCH: 26.8 pg (ref 26.0–34.0)
MCHC: 32.7 g/dL (ref 30.0–36.0)
MCV: 82.2 fL (ref 78.0–100.0)
Platelets: 280 10*3/uL (ref 150–400)
RBC: 4.21 MIL/uL (ref 3.87–5.11)
RDW: 15.5 % (ref 11.5–15.5)
WBC: 18.7 10*3/uL — AB (ref 4.0–10.5)

## 2017-02-20 LAB — ABO/RH: ABO/RH(D): O POS

## 2017-02-20 LAB — RPR: RPR Ser Ql: NONREACTIVE

## 2017-02-20 MED ORDER — OXYTOCIN 40 UNITS IN LACTATED RINGERS INFUSION - SIMPLE MED
1.0000 m[IU]/min | INTRAVENOUS | Status: DC
  Administered 2017-02-20: 2 m[IU]/min via INTRAVENOUS
  Administered 2017-02-20: 6 m[IU]/min via INTRAVENOUS
  Filled 2017-02-20: qty 1000

## 2017-02-20 MED ORDER — ONDANSETRON HCL 4 MG/2ML IJ SOLN
4.0000 mg | Freq: Four times a day (QID) | INTRAMUSCULAR | Status: DC | PRN
  Administered 2017-02-21: 4 mg via INTRAVENOUS
  Filled 2017-02-20: qty 2

## 2017-02-20 MED ORDER — PENICILLIN G POT IN DEXTROSE 60000 UNIT/ML IV SOLN
3.0000 10*6.[IU] | INTRAVENOUS | Status: DC
  Administered 2017-02-20 – 2017-02-21 (×5): 3 10*6.[IU] via INTRAVENOUS
  Filled 2017-02-20 (×11): qty 50

## 2017-02-20 MED ORDER — OXYTOCIN 40 UNITS IN LACTATED RINGERS INFUSION - SIMPLE MED
1.0000 m[IU]/min | INTRAVENOUS | Status: DC
  Administered 2017-02-21: 16 m[IU]/min via INTRAVENOUS

## 2017-02-20 MED ORDER — PENICILLIN G POTASSIUM 5000000 UNITS IJ SOLR
5.0000 10*6.[IU] | Freq: Once | INTRAVENOUS | Status: DC
  Filled 2017-02-20: qty 5

## 2017-02-20 MED ORDER — TERBUTALINE SULFATE 1 MG/ML IJ SOLN: 0.2500 mg | Freq: Once | INTRAMUSCULAR | Status: DC | PRN

## 2017-02-20 MED ORDER — LABETALOL HCL 200 MG PO TABS
200.0000 mg | ORAL_TABLET | Freq: Two times a day (BID) | ORAL | Status: DC
  Administered 2017-02-20 – 2017-02-21 (×4): 200 mg via ORAL
  Filled 2017-02-20 (×4): qty 1

## 2017-02-20 NOTE — Progress Notes (Signed)
Pt seen and examined.   [id number] @ 2551w3d admitted overnight for IOL for SI preeclampsia, severe by BPs.  She has a history of CHTN managed by losartan which was discontinued in early pregnancy.  BPs this pregnancy have been 110-140/70-80s until this week.  Patient reports multiple BPs at home in the 180s/90s with new onset proteinuria.  In the office yesterday, she had multiple severe range BPs.  She was sent to MAU for evaluation and noted to have persistent severe range BPs requiring administration of IV labetalol.  She was admitted to L&D for IOL for superimposed preeclampsia. Labs on admission were otherwise unremarkable.  She was started on magnesium sulfate for seizure prophylaxis.  She received an additional dose of 20mg  IV labetalol at midnight.  BPs since that time have been labile from 140-160s/80-90s.  Urine output has been >100 cc/hr.  She has received 3 doses of cytotec overnight for IOL   Pregnancy c/b: 1. CHTN as above 2. Excessive maternal weight gain and morbid obesity.  Starting weight 330lb with 30+ lb weight gain this pregnancy, BMI 59.   3. Single umbilical artery / suspected fetal macrosomia / polyhdramnios.  Detailed anatomy scans by MFM limited due to maternal habitus.  Limited view of arches, but otherwise within normal limits.    Polyhydramnios--mild.  AFI yesterday 24.5cm.  Fetal growth has been >90% since 27 weeks.  Passed 1hr glucola and A1c 2 weeks ago was 5.4.  Most recent growth US  Yesterday 4418g (9lb 12oz, >90%)   Toco: occ ctx SVE: 0.5/50/posterior  A/p: g3p0020 @ 2951w3d w IOL for superimposed preeclampsia 1. SI preE--repeat labs this AM still normal, though Cr up to 0.75 from 0.59. On magnesium sulfate for seizure ppx w excellent uop.  Labile bps--will start labetalol 200mg  po bid now and cont IV anti-hypertensives if persistently severe.  Goal <160 and < 110 2.  IOL --s/p cytotec x 3--patient refused placement of foley bulb on last exam.  Will place additional dose  of cytotec.  3. Morbid obesity--SCDs 4. Prematurity--s/p BMZ x 1 yesterday, #2 duse 1230 today 5. Fsr/vtx/gbs positive--received loading dose of pcn overnight, but has not received any additional doses.  Will restart with pitocin

## 2017-02-20 NOTE — Progress Notes (Signed)
Went in to reposition patient as baby was experiencing late decelerations. Explained this to pt and explained that we need to change her position. Pt reported that she was also "squeezing her legs together". Asked pt not to do this and also requested that we change her position. As I explained to pt that the baby's heart rate was dropping with whatever it was she was continuing to do, she reported "well let me keep doing it then, haha kidding". Social work consult put in on patient.

## 2017-02-20 NOTE — Progress Notes (Signed)
Pt seen and examined.  Cytotec x 4 overnight.  Attempted to place FB but patient with significant anxiety over exam and unable to tolerate procedure.  Following 2nd attempt at placement, suggested epidural placement now to then allow successful FB placement.  Patient is adamant that she does not want an epidural despite struggling with pain during vaginal exam.  Following attempted foley bulb placement, patient requested a mental break to allow time for discussion with mom.   Patient at this time finally ready to readdress induction of labor.  She declines epidural at this time.  She declines repeat attempt at foley bulb (with or without epidural).  She does agree to starting pitocin with slow titration.   She will take a short " walk" on L&D and then will start pitocin (SVE 2 hrs ago 1/50/high).

## 2017-02-20 NOTE — Progress Notes (Signed)
Dr Kemper Durielarke attempted Foley placement x2. Pt told MD to stop twice, very anxious, and agitated.

## 2017-02-20 NOTE — Progress Notes (Signed)
Dr Chestine Sporelark and pt discussing plan of care

## 2017-02-21 ENCOUNTER — Inpatient Hospital Stay (HOSPITAL_COMMUNITY): Payer: Medicaid Other | Admitting: Anesthesiology

## 2017-02-21 LAB — CBC
HCT: 32.4 % — ABNORMAL LOW (ref 36.0–46.0)
Hemoglobin: 10.7 g/dL — ABNORMAL LOW (ref 12.0–15.0)
MCH: 27.4 pg (ref 26.0–34.0)
MCHC: 33 g/dL (ref 30.0–36.0)
MCV: 82.9 fL (ref 78.0–100.0)
PLATELETS: 276 10*3/uL (ref 150–400)
RBC: 3.91 MIL/uL (ref 3.87–5.11)
RDW: 16 % — AB (ref 11.5–15.5)
WBC: 17.5 10*3/uL — ABNORMAL HIGH (ref 4.0–10.5)

## 2017-02-21 MED ORDER — LIDOCAINE HCL (PF) 1 % IJ SOLN
INTRAMUSCULAR | Status: DC | PRN
  Administered 2017-02-21 (×2): 5 mL
  Administered 2017-02-22: 5 mL via EPIDURAL
  Administered 2017-02-22: 4 mL via EPIDURAL

## 2017-02-21 MED ORDER — DIPHENHYDRAMINE HCL 50 MG/ML IJ SOLN: 12.5000 mg | INTRAMUSCULAR | Status: DC | PRN

## 2017-02-21 MED ORDER — LACTATED RINGERS IV SOLN
500.0000 mL | Freq: Once | INTRAVENOUS | Status: AC
  Administered 2017-02-21: 500 mL via INTRAVENOUS

## 2017-02-21 MED ORDER — PHENYLEPHRINE 40 MCG/ML (10ML) SYRINGE FOR IV PUSH (FOR BLOOD PRESSURE SUPPORT): 80.0000 ug | PREFILLED_SYRINGE | INTRAVENOUS | Status: DC | PRN

## 2017-02-21 MED ORDER — FENTANYL 2.5 MCG/ML BUPIVACAINE 1/10 % EPIDURAL INFUSION (WH - ANES)
14.0000 mL/h | INTRAMUSCULAR | Status: DC | PRN
  Administered 2017-02-21 (×2): 14 mL/h via EPIDURAL
  Filled 2017-02-21 (×3): qty 100

## 2017-02-21 MED ORDER — EPHEDRINE 5 MG/ML INJ: 10.0000 mg | INTRAVENOUS | Status: DC | PRN

## 2017-02-21 MED ORDER — PHENYLEPHRINE 40 MCG/ML (10ML) SYRINGE FOR IV PUSH (FOR BLOOD PRESSURE SUPPORT)
80.0000 ug | PREFILLED_SYRINGE | INTRAVENOUS | Status: DC | PRN
  Filled 2017-02-21 (×2): qty 10

## 2017-02-21 NOTE — Progress Notes (Signed)
  S: Comfortable O: AFVSS FHR 140s with repetative lates, + scalp stim cvx 4-5/80/-2 toco q5  Maternal position changed to right side lates appear to be resolving Overall FWB reassuring

## 2017-02-21 NOTE — Progress Notes (Signed)
  S: Comfortable O: AFVSS FHR 140 with late appearing decels, + scalp stim Cvx 3/80/-2 toco irreg  A/P Lates resolved with position change Overall FWB reass

## 2017-02-21 NOTE — Anesthesia Procedure Notes (Signed)
Epidural Patient location during procedure: OB  Staffing Anesthesiologist: Frederich Montilla, MD Performed: anesthesiologist   Preanesthetic Checklist Completed: patient identified, site marked, surgical consent, pre-op evaluation, timeout performed, IV checked, risks and benefits discussed and monitors and equipment checked  Epidural Patient position: sitting Prep: DuraPrep Patient monitoring: heart rate, continuous pulse ox and blood pressure Approach: midline Location: L3-L4 Injection technique: LOR saline  Needle:  Needle type: Tuohy  Needle gauge: 17 G Needle length: 9 cm and 9 Needle insertion depth: 10 cm Catheter type: closed end flexible Catheter size: 20 Guage Catheter at skin depth: 15 cm Test dose: negative  Assessment Events: blood not aspirated, injection not painful, no injection resistance, negative IV test and no paresthesia  Additional Notes Patient identified. Risks/Benefits/Options discussed with patient including but not limited to bleeding, infection, nerve damage, paralysis, failed block, incomplete pain control, headache, blood pressure changes, nausea, vomiting, reactions to medication both or allergic, itching and postpartum back pain. Confirmed with bedside nurse the patient's most recent platelet count. Confirmed with patient that they are not currently taking any anticoagulation, have any bleeding history or any family history of bleeding disorders. Patient expressed understanding and wished to proceed. All questions were answered. Sterile technique was used throughout the entire procedure. Please see nursing notes for vital signs. Test dose was given through epidural needle and negative prior to continuing to dose epidural or start infusion. Warning signs of high block given to the patient including shortness of breath, tingling/numbness in hands, complete motor block, or any concerning symptoms with instructions to call for help. Patient was given  instructions on fall risk and not to get out of bed. All questions and concerns addressed with instructions to call with any issues.     

## 2017-02-21 NOTE — Progress Notes (Signed)
Pt resting  BP (!) 143/67   Pulse 95   Temp 97.7 F (36.5 C) (Oral)   Resp 18   Ht 5' 7.5" (1.715 m)   Wt (!) 164.2 kg (362 lb)   LMP 06/17/2016   SpO2 98%   BMI 55.86 kg/m  Out: at least 100 cc / hr  Toco: q 3 min EFM: 140s, mod var--cat 1   A/p: g3p0020 @ 7052w4d w IOL for superimposed preeclampsia 1. SI preE-- On magnesium sulfate for seizure ppx w excellent uop.  Labile bps--improved with labetalol 200mg  po bid --has not required IV antihypertensives in last 24 hrs.  Goal <160 and < 110 2.  IOL --s/p cytotec x 4--patient refused placement of foley bulb.  Now on pitocin --still in latent labor --have been unable to AROM due to fetal station.  3. Morbid obesity--SCDs 4. Prematurity--s/p BMZ x 1 yesterday, #2 duse 1230 today 5. Fsr/vtx/gbs positive

## 2017-02-21 NOTE — Progress Notes (Signed)
Patient ID: Anna [name], female   DOB: 11-13-1980, 37 y.o.   MRN: [id number]  S: Becoming more uncomfortable with contractions.  Just received Stadol 30 minutes prior O:  Vitals:   02/21/17 0602 02/21/17 0630 02/21/17 0631 02/21/17 0701  BP: (!) 152/68 (!) 143/75 (!) 143/75 (!) 141/69  Pulse: 93 94 94 90  Resp:    18  Temp:    98.3 F (36.8 C)  TempSrc:    Oral  SpO2:    97%  Weight:      Height:       AOX3 FHR 150 with late appearing decels cvx exam deferred per pt request toco Q 7  Assessment/plan: Discussed with patient need to proceed with regional anesthesia.  Patient is reluctant to have cervical exams due to discomfort.  Discussed with patient that epidural will allow her comfort and rest.  Discussed with patient possibility of placement of fetal scalp electrode after epidural. Patient was amenable to proceeding with epidural placement. Per report patient spontaneously ruptured membranes overnight.  Her current nurse is not convinced of SROM.  Will confirm once epidural placed.

## 2017-02-21 NOTE — Progress Notes (Signed)
  S: Comfortable with epidural O: AFVSS FHR 140s, cat 1 tracing Cvx 1/80/-3 toco irreg  FSE placed  Cont pit FWB reassuring

## 2017-02-21 NOTE — Progress Notes (Signed)
  S: Comforttable O: Vitals:   02/21/17 1931 02/21/17 2001 02/21/17 2031 02/21/17 2101  BP: (!) 129/59 135/71 133/70 133/67  Pulse: 84 89 99 92  Resp: 18 18 18 18   Temp:    98.6 F (37 C)  TempSrc:    Axillary  SpO2:      Weight:      Height:       FHR 140s with early decelerations with contractions Cvx 4-5/80/-2 Toco Q5-7 minutes, MVUs 140  No significant change in dilation MVUs not adequate, however RN having difficulty increasing pit d/t episodes of late decelerations D/W patient and family current status. Reviewed minimal significant change and difficulty with achieving adequate labor. Given EFW on US there is concern for CPD. Discussed that EFW on US in 3rd trimester can be off significantly. Did discuss the possibility of needing to proceed with cesarean section. Will reposition in upright position and re-eval in 1 hr if fetal tracing permits

## 2017-02-21 NOTE — Progress Notes (Signed)
  S: Comfortable with epidural O: AFVSS FHR 140s , reassuring. Some irregular late appearing decelerations. Unable to adequately trace contractions Cvx 3/80/-2 toco irreg  IUPC placed FWB overall reassuring

## 2017-02-22 ENCOUNTER — Encounter (HOSPITAL_COMMUNITY): Payer: Self-pay

## 2017-02-22 ENCOUNTER — Encounter (HOSPITAL_COMMUNITY)
Admission: AD | Disposition: A | Discharge status: Home / Self Care | Payer: Self-pay | Source: Ambulatory Visit | Attending: Obstetrics and Gynecology

## 2017-02-22 LAB — CBC
HCT: 28.6 % — ABNORMAL LOW (ref 36.0–46.0)
Hemoglobin: 9.2 g/dL — ABNORMAL LOW (ref 12.0–15.0)
MCH: 27 pg (ref 26.0–34.0)
MCHC: 32.2 g/dL (ref 30.0–36.0)
MCV: 83.9 fL (ref 78.0–100.0)
PLATELETS: 259 10*3/uL (ref 150–400)
RBC: 3.41 MIL/uL — AB (ref 3.87–5.11)
RDW: 16.1 % — AB (ref 11.5–15.5)
WBC: 20.1 10*3/uL — AB (ref 4.0–10.5)

## 2017-02-22 SURGERY — Surgical Case
Anesthesia: Epidural

## 2017-02-22 MED ORDER — ZOLPIDEM TARTRATE 5 MG PO TABS: 5.0000 mg | ORAL_TABLET | Freq: Every evening | ORAL | Status: DC | PRN

## 2017-02-22 MED ORDER — MENTHOL 3 MG MT LOZG: 1.0000 | LOZENGE | OROMUCOSAL | Status: DC | PRN

## 2017-02-22 MED ORDER — NALBUPHINE HCL 10 MG/ML IJ SOLN: 5.0000 mg | Freq: Once | INTRAMUSCULAR | Status: DC | PRN

## 2017-02-22 MED ORDER — DEXAMETHASONE SODIUM PHOSPHATE 4 MG/ML IJ SOLN
INTRAMUSCULAR | Status: AC
  Filled 2017-02-22: qty 1

## 2017-02-22 MED ORDER — MORPHINE SULFATE (PF) 0.5 MG/ML IJ SOLN
INTRAMUSCULAR | Status: DC | PRN
  Administered 2017-02-22: 4 mg via EPIDURAL
  Administered 2017-02-22: 1 mg via INTRAVENOUS

## 2017-02-22 MED ORDER — NALBUPHINE HCL 10 MG/ML IJ SOLN: 5.0000 mg | INTRAMUSCULAR | Status: DC | PRN

## 2017-02-22 MED ORDER — ONDANSETRON HCL 4 MG/2ML IJ SOLN
INTRAMUSCULAR | Status: DC | PRN
  Administered 2017-02-22: 4 mg via INTRAVENOUS

## 2017-02-22 MED ORDER — IBUPROFEN 600 MG PO TABS
600.0000 mg | ORAL_TABLET | Freq: Four times a day (QID) | ORAL | Status: DC
  Administered 2017-02-22 – 2017-02-25 (×12): 600 mg via ORAL
  Filled 2017-02-22 (×12): qty 1

## 2017-02-22 MED ORDER — DIPHENHYDRAMINE HCL 25 MG PO CAPS
25.0000 mg | ORAL_CAPSULE | Freq: Four times a day (QID) | ORAL | Status: DC | PRN
  Administered 2017-02-22 (×2): 25 mg via ORAL
  Filled 2017-02-22 (×2): qty 1

## 2017-02-22 MED ORDER — TETANUS-DIPHTH-ACELL PERTUSSIS 5-2.5-18.5 LF-MCG/0.5 IM SUSP: 0.5000 mL | Freq: Once | INTRAMUSCULAR | Status: DC

## 2017-02-22 MED ORDER — NALOXONE HCL 0.4 MG/ML IJ SOLN: 0.4000 mg | INTRAMUSCULAR | Status: DC | PRN

## 2017-02-22 MED ORDER — LACTATED RINGERS IV SOLN: INTRAVENOUS | Status: DC

## 2017-02-22 MED ORDER — DIPHENHYDRAMINE HCL 50 MG/ML IJ SOLN: 12.5000 mg | INTRAMUSCULAR | Status: DC | PRN

## 2017-02-22 MED ORDER — SIMETHICONE 80 MG PO CHEW
80.0000 mg | CHEWABLE_TABLET | Freq: Three times a day (TID) | ORAL | Status: DC
  Administered 2017-02-22 – 2017-02-24 (×9): 80 mg via ORAL
  Filled 2017-02-22 (×10): qty 1

## 2017-02-22 MED ORDER — PRENATAL MULTIVITAMIN CH
1.0000 | ORAL_TABLET | Freq: Every day | ORAL | Status: DC
  Administered 2017-02-22 – 2017-02-24 (×3): 1 via ORAL
  Filled 2017-02-22 (×3): qty 1

## 2017-02-22 MED ORDER — COCONUT OIL OIL
1.0000 "application " | TOPICAL_OIL | Status: DC | PRN
  Administered 2017-02-23: 1 via TOPICAL
  Filled 2017-02-22: qty 120

## 2017-02-22 MED ORDER — DIBUCAINE 1 % RE OINT: 1.0000 "application " | TOPICAL_OINTMENT | RECTAL | Status: DC | PRN

## 2017-02-22 MED ORDER — KETOROLAC TROMETHAMINE 30 MG/ML IJ SOLN
INTRAMUSCULAR | Status: AC
  Filled 2017-02-22: qty 1

## 2017-02-22 MED ORDER — SODIUM CHLORIDE 0.9% FLUSH: 3.0000 mL | INTRAVENOUS | Status: DC | PRN

## 2017-02-22 MED ORDER — OXYCODONE-ACETAMINOPHEN 5-325 MG PO TABS
2.0000 | ORAL_TABLET | ORAL | Status: DC | PRN
  Administered 2017-02-23 – 2017-02-24 (×2): 2 via ORAL
  Filled 2017-02-22 (×2): qty 2

## 2017-02-22 MED ORDER — SODIUM CHLORIDE 0.9 % IR SOLN
Status: DC | PRN
  Administered 2017-02-22: 1000 mL

## 2017-02-22 MED ORDER — DEXTROSE 5 % IV SOLN
INTRAVENOUS | Status: DC | PRN
  Administered 2017-02-22: 3 g via INTRAVENOUS

## 2017-02-22 MED ORDER — SCOPOLAMINE 1 MG/3DAYS TD PT72
MEDICATED_PATCH | TRANSDERMAL | Status: AC
  Filled 2017-02-22: qty 2

## 2017-02-22 MED ORDER — PHENYLEPHRINE 40 MCG/ML (10ML) SYRINGE FOR IV PUSH (FOR BLOOD PRESSURE SUPPORT)
PREFILLED_SYRINGE | INTRAVENOUS | Status: AC
  Filled 2017-02-22: qty 10

## 2017-02-22 MED ORDER — SIMETHICONE 80 MG PO CHEW: 80.0000 mg | CHEWABLE_TABLET | ORAL | Status: DC | PRN

## 2017-02-22 MED ORDER — METOCLOPRAMIDE HCL 5 MG/ML IJ SOLN: 10.0000 mg | Freq: Once | INTRAMUSCULAR | Status: DC | PRN

## 2017-02-22 MED ORDER — LIDOCAINE-EPINEPHRINE (PF) 2 %-1:200000 IJ SOLN
INTRAMUSCULAR | Status: AC
  Filled 2017-02-22: qty 20

## 2017-02-22 MED ORDER — SODIUM BICARBONATE 8.4 % IV SOLN
INTRAVENOUS | Status: DC | PRN
  Administered 2017-02-22 (×3): 5 mL via EPIDURAL

## 2017-02-22 MED ORDER — OXYTOCIN 10 UNIT/ML IJ SOLN
INTRAVENOUS | Status: DC | PRN
  Administered 2017-02-22: 40 [IU] via INTRAVENOUS

## 2017-02-22 MED ORDER — NALOXONE HCL 4 MG/10ML IJ SOLN
1.0000 ug/kg/h | INTRAVENOUS | Status: DC | PRN
  Filled 2017-02-22: qty 5

## 2017-02-22 MED ORDER — OXYTOCIN 40 UNITS IN LACTATED RINGERS INFUSION - SIMPLE MED: 2.5000 [IU]/h | INTRAVENOUS | Status: AC

## 2017-02-22 MED ORDER — MORPHINE SULFATE (PF) 0.5 MG/ML IJ SOLN
INTRAMUSCULAR | Status: AC
  Filled 2017-02-22: qty 10

## 2017-02-22 MED ORDER — ONDANSETRON HCL 4 MG/2ML IJ SOLN
4.0000 mg | Freq: Three times a day (TID) | INTRAMUSCULAR | Status: DC | PRN
Start: 2017-02-22 — End: 2017-02-22

## 2017-02-22 MED ORDER — SIMETHICONE 80 MG PO CHEW
80.0000 mg | CHEWABLE_TABLET | ORAL | Status: DC
  Administered 2017-02-22 – 2017-02-25 (×3): 80 mg via ORAL
  Filled 2017-02-22 (×3): qty 1

## 2017-02-22 MED ORDER — KETOROLAC TROMETHAMINE 30 MG/ML IJ SOLN
30.0000 mg | Freq: Four times a day (QID) | INTRAMUSCULAR | Status: DC | PRN
Start: 2017-02-22 — End: 2017-02-22

## 2017-02-22 MED ORDER — ONDANSETRON HCL 4 MG/2ML IJ SOLN
INTRAMUSCULAR | Status: AC
  Filled 2017-02-22: qty 4

## 2017-02-22 MED ORDER — WITCH HAZEL-GLYCERIN EX PADS: 1.0000 "application " | MEDICATED_PAD | CUTANEOUS | Status: DC | PRN

## 2017-02-22 MED ORDER — KETOROLAC TROMETHAMINE 30 MG/ML IJ SOLN
30.0000 mg | Freq: Four times a day (QID) | INTRAMUSCULAR | Status: DC | PRN
  Administered 2017-02-22: 30 mg via INTRAMUSCULAR

## 2017-02-22 MED ORDER — PHENYLEPHRINE HCL 10 MG/ML IJ SOLN
INTRAMUSCULAR | Status: DC | PRN
  Administered 2017-02-22 (×4): 80 ug via INTRAVENOUS
  Administered 2017-02-22: 40 ug via INTRAVENOUS
  Administered 2017-02-22 (×7): 80 ug via INTRAVENOUS

## 2017-02-22 MED ORDER — SCOPOLAMINE 1 MG/3DAYS TD PT72
MEDICATED_PATCH | TRANSDERMAL | Status: DC | PRN
  Administered 2017-02-22: 1 via TRANSDERMAL

## 2017-02-22 MED ORDER — DEXAMETHASONE SODIUM PHOSPHATE 4 MG/ML IJ SOLN
INTRAMUSCULAR | Status: DC | PRN
  Administered 2017-02-22: 4 mg via INTRAVENOUS

## 2017-02-22 MED ORDER — PHENYLEPHRINE 40 MCG/ML (10ML) SYRINGE FOR IV PUSH (FOR BLOOD PRESSURE SUPPORT)
PREFILLED_SYRINGE | INTRAVENOUS | Status: AC
  Filled 2017-02-22: qty 20

## 2017-02-22 MED ORDER — DIPHENHYDRAMINE HCL 25 MG PO CAPS
25.0000 mg | ORAL_CAPSULE | ORAL | Status: DC | PRN
Start: 2017-02-22 — End: 2017-02-22
  Filled 2017-02-22: qty 1

## 2017-02-22 MED ORDER — ACETAMINOPHEN 325 MG PO TABS: 650.0000 mg | ORAL_TABLET | ORAL | Status: DC | PRN

## 2017-02-22 MED ORDER — OXYTOCIN 10 UNIT/ML IJ SOLN
INTRAMUSCULAR | Status: AC
  Filled 2017-02-22: qty 4

## 2017-02-22 MED ORDER — SENNOSIDES-DOCUSATE SODIUM 8.6-50 MG PO TABS
2.0000 | ORAL_TABLET | ORAL | Status: DC
  Administered 2017-02-22 – 2017-02-25 (×3): 2 via ORAL
  Filled 2017-02-22 (×3): qty 2

## 2017-02-22 MED ORDER — OXYCODONE-ACETAMINOPHEN 5-325 MG PO TABS
1.0000 | ORAL_TABLET | ORAL | Status: DC | PRN
  Administered 2017-02-22 – 2017-02-25 (×4): 1 via ORAL
  Filled 2017-02-22 (×4): qty 1

## 2017-02-22 MED ORDER — SODIUM BICARBONATE 8.4 % IV SOLN
INTRAVENOUS | Status: AC
  Filled 2017-02-22: qty 50

## 2017-02-22 MED ORDER — FENTANYL CITRATE (PF) 100 MCG/2ML IJ SOLN: 25.0000 ug | INTRAMUSCULAR | Status: DC | PRN

## 2017-02-22 MED ORDER — MEPERIDINE HCL 25 MG/ML IJ SOLN: 6.2500 mg | INTRAMUSCULAR | Status: DC | PRN

## 2017-02-22 MED ORDER — DEXTROSE 5 % IV SOLN
INTRAVENOUS | Status: AC
  Filled 2017-02-22: qty 3000

## 2017-02-22 SURGICAL SUPPLY — 39 items
ADH SKN CLS APL DERMABOND .7 (GAUZE/BANDAGES/DRESSINGS) ×2
CHLORAPREP W/TINT 26ML (MISCELLANEOUS) ×3 IMPLANT
CLAMP CORD UMBIL (MISCELLANEOUS) ×2 IMPLANT
CLOTH BEACON ORANGE TIMEOUT ST (SAFETY) ×3 IMPLANT
DERMABOND ADVANCED (GAUZE/BANDAGES/DRESSINGS) ×4
DERMABOND ADVANCED .7 DNX12 (GAUZE/BANDAGES/DRESSINGS) IMPLANT
DRESSING DISP NPWT PICO 4X12 (MISCELLANEOUS) ×2 IMPLANT
DRSG OPSITE POSTOP 4X10 (GAUZE/BANDAGES/DRESSINGS) ×3 IMPLANT
ELECT REM PT RETURN 9FT ADLT (ELECTROSURGICAL) ×3
ELECTRODE REM PT RTRN 9FT ADLT (ELECTROSURGICAL) ×1 IMPLANT
EXTENDER TRAXI PANNICULUS (MISCELLANEOUS) IMPLANT
EXTRACTOR VACUUM M CUP 4 TUBE (SUCTIONS) ×1 IMPLANT
EXTRACTOR VACUUM M CUP 4' TUBE (SUCTIONS) ×1
GLOVE BIO SURGEON STRL SZ7 (GLOVE) ×3 IMPLANT
GLOVE BIOGEL PI IND STRL 7.0 (GLOVE) ×1 IMPLANT
GLOVE BIOGEL PI INDICATOR 7.0 (GLOVE) ×2
GOWN STRL REUS W/TWL LRG LVL3 (GOWN DISPOSABLE) ×6 IMPLANT
KIT ABG SYR 3ML LUER SLIP (SYRINGE) IMPLANT
NDL HYPO 25X5/8 SAFETYGLIDE (NEEDLE) IMPLANT
NEEDLE HYPO 22GX1.5 SAFETY (NEEDLE) IMPLANT
NEEDLE HYPO 25X5/8 SAFETYGLIDE (NEEDLE) IMPLANT
NS IRRIG 1000ML POUR BTL (IV SOLUTION) ×3 IMPLANT
PACK C SECTION WH (CUSTOM PROCEDURE TRAY) ×3 IMPLANT
PAD OB MATERNITY 4.3X12.25 (PERSONAL CARE ITEMS) ×3 IMPLANT
PENCIL SMOKE EVAC W/HOLSTER (ELECTROSURGICAL) ×3 IMPLANT
RETRACTOR TRAXI PANNICULUS (MISCELLANEOUS) IMPLANT
RTRCTR C-SECT PINK 25CM LRG (MISCELLANEOUS) ×3 IMPLANT
SUT CHROMIC 1 CTX 36 (SUTURE) ×8 IMPLANT
SUT CHROMIC 2 0 CT 1 (SUTURE) ×3 IMPLANT
SUT PDS AB 0 CTX 60 (SUTURE) ×3 IMPLANT
SUT PLAIN 2 0 XLH (SUTURE) ×2 IMPLANT
SUT VIC AB 2-0 CT1 27 (SUTURE) ×3
SUT VIC AB 2-0 CT1 TAPERPNT 27 (SUTURE) ×1 IMPLANT
SUT VIC AB 4-0 KS 27 (SUTURE) ×2 IMPLANT
SYR 30ML LL (SYRINGE) IMPLANT
TOWEL OR 17X24 6PK STRL BLUE (TOWEL DISPOSABLE) ×3 IMPLANT
TRAXI PANNICULUS EXTENDER (MISCELLANEOUS) ×2
TRAXI PANNICULUS RETRACTOR (MISCELLANEOUS) ×2
TRAY FOLEY BAG SILVER LF 14FR (SET/KITS/TRAYS/PACK) ×3 IMPLANT

## 2017-02-22 NOTE — Brief Op Note (Signed)
02/19/2017 - 02/22/2017  2:45 AM  PATIENT:  Anna [name]  37 y.o. female  PRE-OPERATIVE DIAGNOSIS:  primary cesarean section d/t failure to progress  POST-OPERATIVE DIAGNOSIS:  primary cesarean section d/t failure to progress  PROCEDURE:  Procedure(s): CESAREAN SECTION (N/A)  SURGEON:  Surgeon(s) and Role:    Waynard Reeds* Carleton Vanvalkenburgh, MD - Primary  PHYSICIAN ASSISTANT:   ASSISTANTS: none   ANESTHESIA:   regional  EBL:  1088 mL   BLOOD ADMINISTERED:none  DRAINS: Urinary Catheter (Foley)   LOCAL MEDICATIONS USED:  NONE  SPECIMEN:  Source of Specimen:  Placenta  DISPOSITION OF SPECIMEN:  PATHOLOGY  COUNTS:  YES  TOURNIQUET:  * No tourniquets in log *  DICTATION: .Dragon Dictation  PLAN OF CARE: Admit to inpatient   PATIENT DISPOSITION:  PACU - hemodynamically stable.   Delay start of Pharmacological VTE agent (>24hrs) due to surgical blood loss or risk of bleeding: not applicable

## 2017-02-22 NOTE — Progress Notes (Signed)
Patient educated on breastfeeding and how to use a breast pump. Patient has breast pump in room. Assisted Patient with breast pump setup and encouraged Patient to use breast pump every 2 to 3 hours. Patient verbalizes an understanding of all teaching and returns demonstration of how to use the breast pump properly.

## 2017-02-22 NOTE — Progress Notes (Signed)
Provider @ bedside discussing risk and benefits for cesarean delivery. RN and family at bedside to witness. Provider discussing procedure. No questions comments or concerns from patient or family. Consents signed and verified. Patient prepped for OR 2

## 2017-02-22 NOTE — Anesthesia Postprocedure Evaluation (Signed)
Anesthesia Post Note  Patient: Anna [name]  Procedure(s) Performed: CESAREAN SECTION (N/A )     Patient location during evaluation: [facility] Anesthesia Type: Epidural Level of consciousness: awake and alert Pain management: pain level controlled Vital Signs Assessment: post-procedure vital signs reviewed and stable Respiratory status: spontaneous breathing, nonlabored ventilation and respiratory function stable Cardiovascular status: stable Postop Assessment: no headache, no backache, epidural receding and patient able to bend at knees Anesthetic complications: no    Last Vitals:  Vitals:   02/22/17 0655 02/22/17 0755  BP: 127/60 132/64  Pulse: 77 75  Resp: 20 18  Temp: 36.4 C 36.7 C  SpO2: 95% 96%    Last Pain:  Vitals:   02/22/17 0755  TempSrc: Oral  PainSc:    Pain Goal: Patients Stated Pain Goal: 4 (02/22/17 0552)               Rica RecordsICKELTON,Cloey Sferrazza

## 2017-02-22 NOTE — Plan of Care (Signed)
Patient progressing as expected.

## 2017-02-22 NOTE — Progress Notes (Signed)
POD#1 Pt without complaints. Still on mag. Will stop at 24 hours. B/Ps wnl No headache, vision changes Incision dry IMP/ stable Plan/ Cont mag for 24 hours.

## 2017-02-22 NOTE — Anesthesia Postprocedure Evaluation (Signed)
Anesthesia Post Note  Patient: Anna [name]  Procedure(s) Performed: CESAREAN SECTION (N/A )     Patient location during evaluation: PACU Anesthesia Type: Epidural Level of consciousness: awake and alert and oriented Pain management: pain level controlled Vital Signs Assessment: post-procedure vital signs reviewed and stable Respiratory status: spontaneous breathing, nonlabored ventilation and respiratory function stable Cardiovascular status: blood pressure returned to baseline and stable Postop Assessment: no headache, no backache, epidural receding, patient able to bend at knees and no apparent nausea or vomiting Anesthetic complications: no    Last Vitals:  Vitals:   02/22/17 0345 02/22/17 0400  BP: (!) 116/59 118/61  Pulse: 87 78  Resp: 17 15  Temp:    SpO2: 92% 93%    Last Pain:  Vitals:   02/22/17 0330  TempSrc: Axillary  PainSc: 5    Pain Goal:                 Brooklee Michelin A.

## 2017-02-22 NOTE — Op Note (Signed)
Pre-Operative Diagnosis: 1) 35+5-week intrauterine pregnancy 2) chronic hypertension with superimposed preeclampsia 3) maternal morbid obesity 4) history of malignant hyperthermia 5) failure to progress 6) Advanced maternal age Postoperative Diagnosis: 1) 35+5-week intrauterine pregnancy 2) chronic hypertension with superimposed preeclampsia 3) maternal morbid obesity 4) history of malignant hyperthermia 5) failure to progress 6) Advanced maternal age  Procedure: Primary low transverse cesarean section Surgeon: Dr. Waynard Reeds Assistant: NOne Operative Findings: Vigorous female infant in the vertex presentation with Apgar scores of 6 at 1 minute, 7 at 5 minutes, 8 at 10 minutes.  Normal ovaries, tubes, uterus. Specimen: PLacenta EBL: Total I/O In: 3968.7 [P.O.:960; I.V.:2858.7; IV Piggyback:150] Out: 1513 [Urine:425; Blood:1088]   Procedure:Anna [name] is an 37 year old gravida 3 para 0020 at 58 weeks and 5 days estimated gestational age who presents for cesarean section.  The patient has a history of chronic hypertension for which she was on antihypertensive medications before pregnancy.  She was not on any antihypertensive medications during this pregnancy she was admitted for superimposed preeclampsia and the decision was made to proceed with induction of labor.  She received 4 doses of Cytotec followed by Pitocin after spontaneous rupture of membranes.  She made it to 5 cm dilated but never progressed past the stage.  Adequate labor could never be achieved due to fetal intolerance of adequate contractions.  Therefore, the decision was made to proceed with primary cesarean section for failure to progress.  Following the appropriate informed consent the patient was brought to the operating room where spinal anesthesia was administered and found to be adequate. She was placed in the dorsal supine position with a leftward tilt.  The Traxi panniculus retractor with the extender was used to retract the  panniculus.  She was prepped and draped in the normal sterile fashion.  The patient was appropriately identified during a preoperative timeout procedure.  The scalpel was then used to make a Pfannenstiel skin incision which was carried down to the underlying layers of soft tissue to the fascia. The fascia was incised in the midline and the fascial incision was extended laterally with Mayo scissors. The superior aspect of the fascial incision was grasped with Coker clamps x2, tented up and the rectus muscles dissected off sharply with the electrocautery unit area and the same procedure was repeated on the inferior aspect of the fascial incision. The rectus muscles were separated in the midline. The abdominal peritoneum was identified, tented up, entered sharply, and the incision was extended superiorly and inferiorly with good visualization of the bladder. The Alexis retractor was then deployed. The vesicouterine peritoneum was identified, tented up, entered sharply, and the bladder flap was created digitally. Scalpel was then used to make a low transverse incision on the uterus which was extended laterally with blunt dissection. The fetal vertex was identified, delivered easily through the uterine incision with the assistance of the vacuum extractor, followed by the body. The infant was bulb suctioned on the operative field cried vigorously, cord was clamped and cut and the infant was passed to the waiting neonatologist.  A 1 minute delay in cord clamping was not performed due to the baby demonstrating poor tone and poor response to stimulation on the operative field. The Placenta was then delivered spontaneously, the uterus was cleared of all clot and debris. The uterine incision was repaired with #1 chromic in running locked fashion followed by a second imbricating layer. Ovaries and tubes were inspected and normal. The Alexis retractor was removed. The abdominal peritoneum was reapproximated  with 2-0 Vicryl in a  running fashion, the rectus muscles was reapproximated with 2-0 chromic in a running fashion. The fascia was closed with a looped PDS in a running fashion.  The subcutaneous layer was reapproximated with 2-0 plain gut suture in an interrupted fashion. the skin was closed with 4-0 vicryl in a subcuticular fashion and Dermabond.  A pico negative pressure wound VAC was placed over the incision.  All sponge lap and needle counts were correct. Patient tolerated the procedure well and recovered in stable condition following the procedure.

## 2017-02-22 NOTE — Progress Notes (Signed)
Patient in NICU. Denies any headache, blurry vision, or pain.

## 2017-02-22 NOTE — Lactation Note (Signed)
This note was copied from a baby's chart. Lactation Consultation Note Baby 3 hrs old, STS low bld sugar 24. Hand expression 2 ml colostrum, spoon fed baby. Baby to breast suckled for 2 min. Tongue thrust. Had good latch then let go. Mom has everted short shaft nipples. Areola edema noted. Reveres pressure to relieve edema slightly helpful. Mom has large pendulous breast.   Mom shown how to use DEBP & how to disassemble, clean, & reassemble parts. Mom knows to pump q3h for 15-20 min.  Mom encouraged to feed baby 8-12 times/24 hours and with feeding cues. Mom encouraged to waken baby for feeds.  WH/LC brochure given w/resources, support groups and LC services. Patient Name: Girl Henderson CloudJulie Manzer ZOXWR'UToday's Date: 02/22/2017 Reason for consult: Initial assessment;Other (Comment)(hypoglycemia)   Maternal Data Has patient been taught Hand Expression?: Yes Does the patient have breastfeeding experience prior to this delivery?: No  Feeding Feeding Type: Breast Milk Length of feed: 2 min  LATCH Score Latch: Too sleepy or reluctant, no latch achieved, no sucking elicited.  Audible Swallowing: None  Type of Nipple: Everted at rest and after stimulation  Comfort (Breast/Nipple): Soft / non-tender  Hold (Positioning): Full assist, staff holds infant at breast  LATCH Score: 4  Interventions Interventions: Breast feeding basics reviewed;Reverse pressure;Assisted with latch;Breast compression;Skin to skin;Adjust position;Breast massage;Support pillows;Hand express;Position options;DEBP  Lactation Tools Discussed/Used Tools: Pump Breast pump type: Double-Electric Breast Pump Pump Review: Setup, frequency, and cleaning;Milk Storage Initiated by:: Peri JeffersonL. Terilynn Buresh RN IBCLC Date initiated:: 02/22/17   Consult Status Consult Status: Follow-up Date: 02/22/17 Follow-up type: In-patient    Charyl DancerCARVER, Anastasija Anfinson G 02/22/2017, 5:47 AM

## 2017-02-22 NOTE — Transfer of Care (Signed)
Immediate Anesthesia Transfer of Care Note  Patient: Anna [name]  Procedure(s) Performed: CESAREAN SECTION (N/A )  Patient Location: PACU  Anesthesia Type:Epidural  Level of Consciousness: awake and alert   Airway & Oxygen Therapy: Patient Spontanous Breathing  Post-op Assessment: Report given to RN and Post -op Vital signs reviewed and stable  Post vital signs: Reviewed and stable BP 97/55, HR 78, RR 16, SaO2 96%  Last Vitals:  Vitals:   02/22/17 0103 02/22/17 0105  BP: (!) 120/59 112/64  Pulse: 92 86  Resp: 18 18  Temp:    SpO2:      Last Pain:  Vitals:   02/22/17 0055  TempSrc:   PainSc: 0-No pain         Complications: No apparent anesthesia complications

## 2017-02-22 NOTE — Addendum Note (Signed)
Addendum  created 02/22/17 [id number] by Rica Recordsickelton, Lamari Beckles, CRNA   Charge Capture section accepted, Sign clinical note

## 2017-02-22 NOTE — Anesthesia Procedure Notes (Signed)
Epidural Patient location during procedure: OB Start time: 02/22/2017 12:32 AM  Staffing Anesthesiologist: Mal AmabileFoster, Janine Reller, MD Performed: anesthesiologist   Preanesthetic Checklist Completed: patient identified, site marked, surgical consent, pre-op evaluation, timeout performed, IV checked, risks and benefits discussed and monitors and equipment checked  Epidural Patient position: sitting Prep: site prepped and draped and DuraPrep Patient monitoring: continuous pulse ox and blood pressure Approach: midline Location: L3-L4 Injection technique: LOR air  Needle:  Needle type: Tuohy  Needle gauge: 17 G Needle length: 9 cm and 9 Needle insertion depth: 5 cm cm Catheter type: closed end flexible Catheter size: 19 Gauge Catheter at skin depth: 15 and 16 cm Test dose: negative and Other  Assessment Events: blood not aspirated, injection not painful, no injection resistance, negative IV test and no paresthesia  Additional Notes Patient identified. Risks and benefits discussed including failed block, incomplete  Pain control, post dural puncture headache, nerve damage, paralysis, blood pressure Changes, nausea, vomiting, reactions to medications-both toxic and allergic and post Partum back pain. All questions were answered. Patient expressed understanding and wished to proceed. Sterile technique was used throughout procedure. Epidural site was Dressed with sterile barrier dressing. No paresthesias, signs of intravascular injection Or signs of intrathecal spread were encountered.  Patient was more comfortable after the epidural was dosed. Please see RN's note for documentation of vital signs and FHR which are stable.

## 2017-02-22 NOTE — Plan of Care (Signed)
Patient is alert and oriented x4. Patient denies any pain at this time. Patient denies any headache or blurry vision. Patient is able to voice needs.

## 2017-02-23 LAB — CBC
HCT: 28.8 % — ABNORMAL LOW (ref 36.0–46.0)
Hemoglobin: 9.3 g/dL — ABNORMAL LOW (ref 12.0–15.0)
MCH: 27 pg (ref 26.0–34.0)
MCHC: 32.3 g/dL (ref 30.0–36.0)
MCV: 83.7 fL (ref 78.0–100.0)
PLATELETS: 249 10*3/uL (ref 150–400)
RBC: 3.44 MIL/uL — ABNORMAL LOW (ref 3.87–5.11)
RDW: 16 % — AB (ref 11.5–15.5)
WBC: 14.5 10*3/uL — ABNORMAL HIGH (ref 4.0–10.5)

## 2017-02-23 NOTE — Plan of Care (Signed)
Patient is progressing as expected. 

## 2017-02-23 NOTE — Plan of Care (Signed)
Patient progressing as expected.

## 2017-02-23 NOTE — Lactation Note (Addendum)
This note was copied from a baby's chart. Lactation Consultation Note  Patient Name: Anna Henderson CloudJulie Basurto ZOXWR'UToday's Date: 02/23/2017 Reason for consult: Follow-up assessment;NICU baby;1st time breastfeeding;Late-preterm 34-36.6wks   Follow up with mom of 45 hour old NICU infant. Infant is feeding with a bottle in the NICU. Enc mom to ask to latch infant in the NICU when visiting.   Mom is pumping about every 3 hours. She is getting small amounts of colostrum. Mom with large pendulous breasts with short shaft everted nipples. Mom prefers to pump one breast at a time.   Gave mom Providing Breast milk for your baby in NICU booklet. Enc mom to pump 8-12 x a day with a 4-5 hour stretch at night of rest with no pumping. Enc mom to follow pumping with hand expression. Reviewed what to expect with milk coming to volume. Reviewed initiate setting on the pump. Mom was given colostrum collection containers to take milk to the NICU. Reviewed labeling of breast milk, mom has labels.   Mom was given Coconut oil to use with pumping as she is experiencing some nipple tenderness with pumping, enc mom to keep pump suction level at tolerable level.   Mom to call out for further assistance as needed. Mom reports all questions have been answered.   Review[facility] pump rental at d[facility]c [facility].      Maternal Data Has patient been taught Hand Expression?: Yes Does the patient have breastfeeding experience prior to this delivery?: No  Feeding    LATCH Score                   Interventions    Lactation Tools Discussed/Used WIC Program: Yes Pump Review: Setup, frequency, and cleaning;Milk Storage Initiated by:: Noralee StainSharon Hice, RN, IBCLC   Consult Status Consult Status: Follow-up Date: 02/24/17 Follow-up type: In-patient    Silas FloodSharon S Hice 02/23/2017, 11:21 PM

## 2017-02-23 NOTE — Plan of Care (Signed)
Patient is voiding and passing stool without any difficulty. Patient denies any pain or discomfort at this time. Patient denies any headache, blurry vision, or weakness.

## 2017-02-24 NOTE — Progress Notes (Signed)
POD#3 Pt is in NICU. She is reportedly doing well.  VSSAF off mag IMP/ Stable  Plan/ Will discharge in am

## 2017-02-24 NOTE — Plan of Care (Signed)
Patient is sitting up in bed at this time. Denies any pain or discomfort at this time. Patient is voiding and passing stool without any difficulty.

## 2017-02-25 MED ORDER — OXYCODONE-ACETAMINOPHEN 5-325 MG PO TABS: 2.0000 | ORAL_TABLET | ORAL | 0 refills | Status: DC | PRN

## 2017-02-25 NOTE — Lactation Note (Signed)
This note was copied from a baby's chart. Lactation Consultation Note  Patient Name: Anna [name] ZOXWR'UToday's Date: 02/25/2017 Reason for consult: Follow-up assessment;NICU baby;Late-preterm 34-36.6wks;1st time breastfeeding   Follow up with mom of 6779 hour old NICU infant. Mom reports she is pumping and hand expressing, She is getting very small volumes. Enc mom to continue pumping 8-12 x a day follow with hand expression.   Reviewed milk transport to the NICU. Mom has colostrum containers and bottles for home use. Engorgement prevention/treatment reviewed with mom.   [facility]   Mom reports she has no further questions/concerns at this time.    Maternal Data Has patient been taught Hand Expression?: Yes Does the patient have breastfeeding experience prior to this delivery?: No  Feeding Feeding Type: Formula Nipple Type: Slow - flow Length of feed: 25 min  LATCH Score                   Interventions    Lactation Tools Discussed/Used WIC Program: Yes Pump Review: Setup, frequency, and cleaning;Milk Storage Initiated by:: Reviewed and encouraged 8-12 x in 24 hours   Consult Status Consult Status: Complete Follow-up type: Call as needed    Anna [name] 02/25/2017, 9:40 AM

## 2017-02-25 NOTE — Progress Notes (Signed)
Discharge instructions and prescription given, questions answered, pt states understanding, signs and given copy 

## 2017-02-25 NOTE — Discharge Summary (Signed)
Obstetric Discharge Summary Reason for Admission: induction of labor and CHTN with superimposed preeclampsia Prenatal Procedures: Preeclampsia and ultrasound Intrapartum Procedures: cesarean: low cervical, transverse Postpartum Procedures: none Complications-Operative and Postpartum: none Hemoglobin  Date Value Ref Range Status  02/23/2017 9.3 (L) 12.0 - 15.0 g/dL Final   HCT  Date Value Ref Range Status  02/23/2017 28.8 (L) 36.0 - 46.0 % Final    Physical Exam:  General: alert and cooperative Lochia: appropriate Uterine Fundus: unable to palpate d/t habitus Incision: healing well, no significant drainage, wound vac in place DVT Evaluation: No evidence of DVT seen on physical exam.  Discharge Diagnoses: Preelampsia- superimposed on CHTN  Discharge Information: Date: 02/25/2017 Activity: pelvic rest Diet: routine Medications: PNV, Ibuprofen, Colace and Percocet Condition: stable and improved Instructions: refer to practice specific booklet Discharge to: home Follow-up Information    Anna [name], [name], MD Follow up in 5 day(s).   Specialty:  Obstetrics and Gynecology Why:  BP check with Dr. Tenny [name] Friday, f/u incision check in 2 weeks, postpartum visit 4-6 weeks Contact information: [address] [place] [address] [phone]           Newborn Data: Live born female  Birth Weight: 7 lb 9.5 oz (3445 g) APGAR: 6, 7  Newborn Delivery   Birth date/time:  02/22/2017 01:59:00 Delivery type:  C-Section, Vacuum Assisted C-section categorization:  Primary     Home with baby in NICU at mother's discharge.  Anna [name], Anna [name] 02/25/2017, 10:21 AM

## 2017-02-26 NOTE — H&P (Signed)
Please refer to full NP note below.       This is a 37 y.o. G3P0020 [redacted]w[redacted]d who has a history of CHTN treated with Losartan but was stopped when she found out she was pregnant.  Blood pressures began to become elevated to 140s/80s as early as 26 weeks but did not meet criteria for restarting BP meds.       This week the pt states she has been checking her BPs at home and they were in severe range (180s/90-110s).  Today she c/o of nausea but no other preeclampsia sx.     On exam in office, pt's BP was 160/80 x2;  Protein in urine 1+ (had been trace.)    She was sent to MAU for immediate eval and has BPs requiring Labetalol for control.  Her labs were normal except for Pr/Cr of 0.23.       Her pregnancy is otherwise complicated by AMA, single umbilical artery and poly.  A growth scan today showed EFW 9#12, VTX, AFI 24 (poly) and BPP 8/8.  She has had reassuring ANT since 32 weeks.     I have admitted pt for obs and BMZ shot. After reviewing everything, I have decided to proceed with delivery for [facility] with superimposed severe preeclampsia.   Will give cytotec and second BMZ shot if still pregnant in 12 hours.          Prenatal Transfer Tool    Maternal Diabetes: No- both 1 hour normal and later A1C were completely normal  Genetic Screening: Normal- NIPT low risk female  Maternal Ultrasounds/Referrals: Abnormal:  Findings:   Other: single umbilical artery  Fetal Ultrasounds or other Referrals:  Referred to Materal Fetal Medicine - f/u anatomy  Maternal Substance Abuse:  No  Significant Maternal Medications:  None  Significant Maternal Lab Results: None      Orean Giarratano A                                   Chief Complaint:  Hypertension      First Provider Initiated Contact with Patient 02/19/17 1223          HPI: Anna [name] is a 37 y.o. G3P0020 at [redacted]w[redacted]d who presents to maternity admissions being sent from the office for  Hypertension. She is dx with chronic hypertension and here to r/o superimposed preeclamspia. She reports not being on any medication during pregnancy. She states that she first noticed elevated BP of over 180/100's three days ago- but was not seen due to her having hypertension prior to pregnancy. She was in the office today where her BP was 160/80. She reports increased swelling of ankles and feet. She denies HA, vision changes, epigastric pain. She reports good fetal movement, denies LOF, vaginal bleeding, vaginal itching/burning, urinary symptoms, dizziness, n/v, or fever/chills.       Past Medical History:       Past Medical History:    Diagnosis   Date    .   Anxiety        .   Chlamydia        .   Depression        .   Hypertension        .   Malignant hyperthermia            PT STATES DX WITH MALIGNANT HYPERTHERMIA WHEN SHE HAD ADNOIDECTOMY AT AGE 67-DID WEAR ALERT  BRACELET AS A CHILD-BUT NO LONGER WEARS.    Marland Kitchen   Trichomoniasis   2018          Past obstetric history:                   OB History    Gravida   Para   Term   Preterm   AB   Living    3   1       1   2   1     SAB   TAB   Ectopic   Multiple   Live Births    1   1       0   1         #   Outcome   Date   GA   Lbr Len/2nd   Weight   Sex   Delivery   Anes   PTL   Lv    3   Preterm   02/22/17   [redacted]w[redacted]d       7 lb 9.5 oz (3.445 kg)   F   CS-Vac   EPI       LIV    2   TAB                                        1   SAB                                                   Past Surgical History:        Past Surgical History:    Procedure   Laterality   Date    .   ADENOIDECTOMY AT AGE 61                surgery never completed-because of malignant hyperthermia complications    .   CESAREAN SECTION   N/A    02/22/2017        Procedure: CESAREAN SECTION;  Surgeon: Waynard Reeds, MD;  Location: [facility] BIRTHING SUITES;  Service: Obstetrics;  Laterality: N/A;    .   CHOLECYSTECTOMY       05/16/2011        Procedure: LAPAROSCOPIC CHOLECYSTECTOMY WITH INTRAOPERATIVE CHOLANGIOGRAM;  Surgeon: Clovis Pu. Cornett, MD;  Location: [facility] ORS;  Service: General;  Laterality: N/A;  Laparoscopic Cholecystectomy and Cholangiogram       .   wisdom teeth extracted       2000        pt states surgery was done at [facility]-BECAUSE OF HER PAST HX MALIGNANT HYPERTHERMIA-SURGERY BY DR. Warren Danes          Family History:  History reviewed. No pertinent family history.     Social History:   Social History             Tobacco Use    .   Smoking status:   Former Smoker            Years:   7.00            Types:   Cigars            Last attempt to quit:   07/16/2016  Years since quitting:   0.6    .   Smokeless tobacco:   Never Used    .   Tobacco comment: 7 CIGARS IN A PACK -SMOKES ONE PACK DAILY    Substance Use Topics    .   Alcohol use:   Yes            Comment: RARELY    .   Drug use:   No          Allergies:         Allergies    Allergen   Reactions    .   Anectine [Succinylcholine Chloride]   Other (See Comments)            Malignant Hyperthemia             Meds:    No medications prior to admission.          ROS:   Review of Systems   Constitutional: Negative.    Respiratory: Negative.    Cardiovascular: Negative.    Gastrointestinal: Negative.    Genitourinary: Negative.    Musculoskeletal: Negative.    Neurological: Negative.    Psychiatric/Behavioral: Negative.    Positive for hypertension      I have reviewed patient's Past Medical Hx, Surgical Hx, Family Hx, Social Hx, medications and allergies.       Physical Exam        No data found.  Constitutional: Well-developed, morbid obese female in no acute distress.   Cardiovascular: normal rate  Respiratory: normal effort  GI: Abd soft, non-tender, gravid appropriate for gestational age. Fetus measuring 9-12oz on last [place].   MS: Extremities nontender, +2 pitting edema in ankles, normal ROM, DTR +1, no clonus   Neurologic: Alert and oriented x 4.   GU: Neg CVAT.     Cervical exam: deferred, patient states she just had her cervix checked in the office. Reports it being "high/thick".      FHT:  Baseline 145 , moderate variability, accelerations present, no decelerations  Contractions: none     Labs:   Lab Results Last 24 Hours          MAU Course/MDM:      Orders Placed This Encounter    Procedures    .   CBC    .   Comprehensive metabolic panel    .   Protein / creatinine ratio, urine    .   Urinalysis, Routine w reflex microscopic    .   OB RESULTS CONSOLE GC/Chlamydia    .   OB RESULTS CONSOLE RPR    .   OB RESULTS CONSOLE HIV antibody    .   OB RESULTS CONSOLE Rubella Antibody    .   OB RESULTS CONSOLE Hepatitis B surface antigen    .   RPR    .   Comprehensive metabolic panel    .   CBC    .   CBC    .   CBC    .   CBC    .   Diet - low sodium heart healthy    .   Measure blood pressure    .   Patient has an active order for code status: continue current code status order    .   Discharge instructions    .   Call MD for:  extreme fatigue    .   Call MD for:  persistant dizziness or  light-headedness    .   Call MD for:  hives    .   Call MD for:  redness, tenderness, or signs of infection (pain, swelling, redness, odor or green/yellow discharge around incision site)    .   Call MD for:  severe uncontrolled pain    .   Call MD for:  persistant nausea and vomiting    .   Call MD for:  difficulty breathing, headache  or visual disturbances    .   Call MD for:  temperature >100.4    .   Call MD for:    .   Activity as tolerated    .   Lifting restrictions    .   Driving restriction     .   Sexual acrtivity    .   Discharge wound care:    Marland Kitchen   Type and screen [facility] [place]    .   ABO/Rh    .   Place in observation (patient's expected length of stay will be less than 2 midnights)    .   Admit to Inpatient (patient's expected length of stay will be greater than 2 midnights or inpatient only procedure)    .   Admit to Inpatient (patient's expected length of stay will be greater than 2 midnights or inpatient only procedure)    .   Transfer patient    PreE labs- normal   PCR- elevated to 0.23           Meds ordered this encounter    Medications    .   DISCONTD: labetalol (NORMODYNE,TRANDATE) injection 20-80 mg    .   DISCONTD: hydrALAZINE (APRESOLINE) injection 10 mg    .   DISCONTD: lactated ringers infusion    .   betamethasone acetate-betamethasone sodium phosphate (CELESTONE) injection 12 mg    .   DISCONTD: acetaminophen (TYLENOL) tablet 650 mg    .   DISCONTD: zolpidem (AMBIEN) tablet 5 mg    .   DISCONTD: docusate sodium (COLACE) capsule 100 mg    .   DISCONTD: calcium carbonate (TUMS - dosed in mg elemental calcium) chewable tablet 400 mg of elemental calcium    .   DISCONTD: prenatal multivitamin tablet 1 tablet    .   betamethasone acetate-betamethasone sodium phosphate (CELESTONE) injection 12 mg    .   DISCONTD: lactated ringers infusion    .   DISCONTD: oxytocin (PITOCIN) IV BOLUS FROM BAG    .   DISCONTD: oxytocin (PITOCIN) IV infusion 40 units in LR 1000 mL - Premix    .   DISCONTD: lactated ringers infusion 500-1,000 mL    .   DISCONTD: oxyCODONE-acetaminophen (PERCOCET/ROXICET) 5-325 MG per tablet 1 tablet    .   DISCONTD: oxyCODONE-acetaminophen  (PERCOCET/ROXICET) 5-325 MG per tablet 2 tablet    .   DISCONTD: sodium citrate-citric acid (ORACIT) solution 30 mL    .   DISCONTD: lidocaine (PF) (XYLOCAINE) 1 % injection 30 mL    .   DISCONTD: terbutaline (BRETHINE) injection 0.25 mg    .   DISCONTD: misoprostol (CYTOTEC) tablet 25 mcg    .   DISCONTD: penicillin G potassium 5 Million Units in dextrose 5 % 250 mL IVPB            Order Specific Question:     Antibiotic Indication:            Answer:  Group B Strep Prophylaxis    .   DISCONTD: penicillin G potassium 3 Million Units in dextrose 50mL IVPB            Order Specific Question:     Antibiotic Indication:            Answer:     Group B Strep Prophylaxis    .   DISCONTD: promethazine (PHENERGAN) injection 25 mg    .   famotidine (PEPCID) IVPB 20 mg premix    .   DISCONTD: butorphanol (STADOL) injection 1 mg    .   DISCONTD: penicillin G potassium 5 Million Units in dextrose 5 % 250 mL IVPB            Order Specific Question:     Antibiotic Indication:            Answer:     Group B Strep Prophylaxis    .   DISCONTD: penicillin G potassium 3 Million Units in dextrose 50mL IVPB            Order Specific Question:     Antibiotic Indication:            Answer:     Group B Strep Prophylaxis    .   DISCONTD: labetalol (NORMODYNE,TRANDATE) injection 20-80 mg    .   DISCONTD: hydrALAZINE (APRESOLINE) injection 10 mg    .   magnesium bolus via infusion 4 g    .   magnesium sulfate 40 grams in LR 500 mL OB infusion    .   DISCONTD: labetalol (NORMODYNE) tablet 200 mg    .   DISCONTD: ondansetron (ZOFRAN) injection 4 mg    .   DISCONTD: terbutaline (BRETHINE) injection 0.25 mg    .   DISCONTD: oxytocin (PITOCIN) IV infusion 40 units in LR 1000 mL - Premix            Order Specific Question:     Begin infusion at:            Answer:      2 milli-units/min (3 mL/hr)            Order Specific Question:     Increase infusion by:            Answer:     2 milli-units/min (3 mL/hr)    .   DISCONTD: penicillin G potassium 5 Million Units in dextrose 5 % 250 mL IVPB            Order Specific Question:     Antibiotic Indication:            Answer:     Group B Strep Prophylaxis    .   DISCONTD: penicillin G potassium 3 Million Units in dextrose 50mL IVPB            Order Specific Question:     Antibiotic Indication:            Answer:     Group B Strep Prophylaxis    .   DISCONTD: oxytocin (PITOCIN) IV infusion 40 units in LR 1000 mL - Premix            Order Specific Question:     Begin infusion at:            Answer:     1 milli-unit/min (1.5 mL/hr)            Order Specific Question:     Increase  infusion by:            Answer:     1 milli-unit/min (1.5 mL/hr)    .   DISCONTD: ePHEDrine injection 10 mg    .   DISCONTD: PHENYLephrine 40 mcg/ml in normal saline Adult IV Push Syringe    .   lactated ringers infusion 500 mL    .   DISCONTD: fentaNYL 2.5 mcg/ml w/bupivacaine 0.1% in NS 100ml epidural infusion (WH-ANES)    .   DISCONTD: diphenhydrAMINE (BENADRYL) injection 12.5 mg    .   DISCONTD: ePHEDrine injection 10 mg    .   DISCONTD: PHENYLephrine 40 mcg/ml in normal saline Adult IV Push Syringe    .   DISCONTD: ketorolac (TORADOL) 30 MG/ML injection 30 mg    .   DISCONTD: ketorolac (TORADOL) 30 MG/ML injection 30 mg    .   DISCONTD: naloxone (NARCAN) injection 0.4 mg    .   DISCONTD: sodium chloride flush (NS) 0.9 % injection 3 mL    .   DISCONTD: ondansetron (ZOFRAN) injection 4 mg    .   DISCONTD: nalbuphine (NUBAIN) injection 5 mg    .   DISCONTD: nalbuphine (NUBAIN) injection 5 mg    .   DISCONTD: nalbuphine (NUBAIN) injection 5 mg    .   DISCONTD: nalbuphine  (NUBAIN) injection 5 mg    .   DISCONTD: diphenhydrAMINE (BENADRYL) injection 12.5 mg    .   DISCONTD: diphenhydrAMINE (BENADRYL) capsule 25 mg    .   DISCONTD: naloxone HCl (NARCAN) 2 mg in dextrose 5 % 250 mL infusion    .   DISCONTD: meperidine (DEMEROL) injection 6.25 mg    .   DISCONTD: fentaNYL (SUBLIMAZE) injection 25-50 mcg    .   DISCONTD: metoCLOPramide (REGLAN) injection 10 mg    .   DISCONTD: sodium chloride irrigation 0.9 %    .   oxytocin (PITOCIN) IV infusion 40 units in LR 1000 mL - Premix    .   DISCONTD: lactated ringers infusion    .   DISCONTD: ibuprofen (ADVIL,MOTRIN) tablet 600 mg    .   DISCONTD: acetaminophen (TYLENOL) tablet 650 mg    .   DISCONTD: zolpidem (AMBIEN) tablet 5 mg    .   DISCONTD: diphenhydrAMINE (BENADRYL) capsule 25 mg    .   DISCONTD: senna-docusate (Senokot-S) tablet 2 tablet    .   DISCONTD: simethicone (MYLICON) chewable tablet 80 mg    .   DISCONTD: simethicone (MYLICON) chewable tablet 80 mg    .   DISCONTD: simethicone (MYLICON) chewable tablet 80 mg    .   DISCONTD: prenatal multivitamin tablet 1 tablet    .   DISCONTD: menthol-cetylpyridinium (CEPACOL) lozenge 3 mg    .   DISCONTD: witch hazel-glycerin (TUCKS) pad 1 application    .   DISCONTD: dibucaine (NUPERCAINAL) 1 % rectal ointment 1 application    .   DISCONTD: coconut oil    .   DISCONTD: Tdap (BOOSTRIX) injection 0.5 mL    .   DISCONTD: oxyCODONE-acetaminophen (PERCOCET/ROXICET) 5-325 MG per tablet 1 tablet    .   DISCONTD: oxyCODONE-acetaminophen (PERCOCET/ROXICET) 5-325 MG per tablet 2 tablet    .   ketorolac (TORADOL) 30 MG/ML injection            Suzie PortelaPayne, Shantonette  : cabinet override    .   DISCONTD: ketorolac (TORADOL) 30 MG/ML injection  Poore, Elizabeth   : cabinet override    .   oxyCODONE-acetaminophen (PERCOCET/ROXICET) 5-325 MG tablet             Sig: Take 2 tablets by mouth every 4 (four) hours as needed (pain scale > 7).            Dispense:  30 tablet            Refill:  0       NST reviewed- reactive   Consult Dr Henderson Cloud with exam findings and test results. Recommends admission to antenatal for observation. Repeat labs tomorrow with second BMZ shot.   Treatments in MAU included 20 mg Labetalol with LR at IV- hypertension resolved with one dose of medication.          Assessment:   1.   Chronic hypertension during pregnancy, antepartum     2.   Cesarean delivery, delivered, current hospitalization        Plan:  Admit to Antenatal for observation   Care transferred to Dr Henderson Cloud for management   Orders placed for observation         Steward Drone  Certified Nurse-Midwife  02/26/2017  8:30 AM

## 2017-02-26 NOTE — MAU Provider Note (Signed)
Please refer to full NP note below.    This is Anna [name] 37 y.o. G3P0020 [redacted]w[redacted]d who has Anna [name] history of CHTN treated with Losartan but was stopped when she found out she was pregnant.  Blood pressures began to become elevated to 140s/80s as early as 26 weeks but did not meet criteria for restarting BP meds.    This week the pt states she has been checking her BPs at home and they were in severe range (180s/90-110s).  Today she c/o of nausea but no other preeclampsia sx.  On exam in office, pt's BP was 160/80 x2;  Protein in urine 1+ (had been trace.)   She was sent to MAU for immediate eval and has BPs requiring Labetalol for control.  Her labs were normal except for Pr/Cr of 0.23.    Her pregnancy is otherwise complicated by AMA, single umbilical artery and poly.  Anna [name] growth scan today showed EFW 9#12, VTX, AFI 24 (poly) and BPP 8/8.  She has had reassuring ANT since 32 weeks.  I have admitted pt for obs and BMZ shot. After reviewing everything, I have decided to proceed with delivery for [facility] with superimposed severe preeclampsia.   Will give cytotec and second BMZ shot if still pregnant in 12 hours.    Prenatal Transfer Tool  Maternal Diabetes: No- both 1 hour normal and later A1C were completely normal Genetic Screening: Normal- NIPT low risk female Maternal Ultrasounds/Referrals: Abnormal:  Findings:   Other: single umbilical artery Fetal Ultrasounds or other Referrals:  Referred to Materal Fetal Medicine - f/u anatomy Maternal Substance Abuse:  No Significant Maternal Medications:  None Significant Maternal Lab Results: None   Anna Anna [name]            Chief Complaint:  Hypertension   First Provider Initiated Contact with Patient 02/19/17 1223      HPI: Anna Anna [name] is Anna [name] 37 y.o. G3P0020 at 101w2d who presents to maternity admissions being sent from the office for Hypertension. She is dx with chronic hypertension and here to r/o superimposed preeclamspia. She reports not being on  any medication during pregnancy. She states that she first noticed elevated BP of over 180/100's three days ago- but was not seen due to her having hypertension prior to pregnancy. She was in the office today where her BP was 160/80. She reports increased swelling of ankles and feet. She denies HA, vision changes, epigastric pain. She reports good fetal movement, denies LOF, vaginal bleeding, vaginal itching/burning, urinary symptoms, dizziness, n/v, or fever/chills.    Past Medical History: Past Medical History:  Diagnosis Date  . Anxiety   . Chlamydia   . Depression   . Hypertension   . 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  Marland Kitchen Trichomoniasis 2018    Past obstetric history: OB History  Gravida Para Term Preterm AB Living  3 1   1 2 1   SAB TAB Ectopic Multiple Live Births  1 1   0 1    # Outcome Date GA Lbr Len/2nd Weight Sex Delivery Anes PTL Lv  3 Preterm 02/22/17 [redacted]w[redacted]d  7 lb 9.5 oz (3.445 kg) F CS-Vac EPI  LIV  2 TAB           1 SAB               Past Surgical History: Past Surgical History:  Procedure  Laterality Date  . ADENOIDECTOMY AT AGE 58     surgery never completed-because of malignant hyperthermia complications  . CESAREAN SECTION N/Anna [name] 02/22/2017   Procedure: CESAREAN SECTION;  Surgeon: [name], [name], MD;  [facility];  Service: Obstetrics;  Laterality: N/Anna [name];  . CHOLECYSTECTOMY  05/16/2011   Procedure: LAPAROSCOPIC CHOLECYSTECTOMY WITH INTRAOPERATIVE CHOLANGIOGRAM;  Surgeon: Clovis Puhomas Anna [name]. Cornett, MD;  Location: [facility] ORS;  Service: General;  Laterality: N/Anna [name];  Laparoscopic Cholecystectomy and Cholangiogram   . wisdom teeth extracted  2000   pt states surgery was done at [facility]-BECAUSE OF HER PAST HX MALIGNANT HYPERTHERMIA-SURGERY BY DR. Warren [facility]ARP[facility]    Family History: History reviewed. No pertinent family history.  Social History: Social  History   Tobacco Use  . Smoking status: Former Smoker    Years: 7.00    Types: Cigars    Last attempt to quit: 07/16/2016    Years since quitting: 0.6  . Smokeless tobacco: Never Used  . Tobacco comment: 7 CIGARS IN Anna [name] PACK -SMOKES ONE PACK DAILY  Substance Use Topics  . Alcohol use: Yes    Comment: RARELY  . Drug use: No    Allergies:  Allergies  Allergen Reactions  . Anectine [Succinylcholine Chloride] Other (See Comments)    Malignant Hyperthemia     Meds:  No medications prior to admission.    ROS:  Review of Systems  Constitutional: Negative.   Respiratory: Negative.   Cardiovascular: Negative.   Gastrointestinal: Negative.   Genitourinary: Negative.   Musculoskeletal: Negative.   Neurological: Negative.   Psychiatric/Behavioral: Negative.   Positive for hypertension   I have reviewed patient's Past Medical Hx, Surgical Hx, Family Hx, Social Hx, medications and allergies.   Physical Exam   No data found. Constitutional: Well-developed, morbid obese female in no acute distress.  Cardiovascular: normal rate Respiratory: normal effort GI: Abd soft, non-tender, gravid appropriate for gestational age. Fetus measuring 9-12oz on last US.  MS: Extremities nontender, +2 pitting edema in ankles, normal ROM, DTR +1, no clonus  Neurologic: Alert and oriented x 4.  GU: Neg CVAT.  Cervical exam: deferred, patient states she just had her cervix checked in the office. Reports it being "high/thick".   FHT:  Baseline 145 , moderate variability, accelerations present, no decelerations Contractions: none   Labs: No results found for this or any previous visit (from the past 24 hour(s)).  MAU Course/MDM: Orders Placed This Encounter  Procedures  . CBC  . Comprehensive metabolic panel  . Protein / creatinine ratio, urine  . Urinalysis, Routine w reflex microscopic  . OB RESULTS CONSOLE GC/Chlamydia  . OB RESULTS CONSOLE RPR  . OB RESULTS CONSOLE HIV antibody  . OB  RESULTS CONSOLE Rubella Antibody  . OB RESULTS CONSOLE Hepatitis B surface antigen  . RPR  . Comprehensive metabolic panel  . CBC  . CBC  . CBC  . CBC  . Diet - low sodium heart healthy  . Measure blood pressure  . Patient has an active order for code status: continue current code status order  . Discharge instructions  . Call MD for:  extreme fatigue  . Call MD for:  persistant dizziness or light-headedness  . Call MD for:  hives  . Call MD for:  redness, tenderness, or signs of infection (pain, swelling, redness, odor or green/yellow discharge around incision site)  . Call MD for:  severe uncontrolled pain  . Call MD for:  persistant nausea and vomiting  . Call MD for:  difficulty  breathing, headache or visual disturbances  . Call MD for:  temperature >100.4  . Call MD for:  . Activity as tolerated  . Lifting restrictions  . Driving restriction   . Sexual acrtivity  . Discharge wound care:  Marland Kitchen Type and screen [facility]  . ABO/Rh  . Place in observation (patient's expected length of stay will be less than 2 midnights)  . Admit to Inpatient (patient's expected length of stay will be greater than 2 midnights or inpatient only procedure)  . Admit to Inpatient (patient's expected length of stay will be greater than 2 midnights or inpatient only procedure)  . Transfer patient  PreE labs- normal  PCR- elevated to 0.23  Meds ordered this encounter  Medications  . DISCONTD: labetalol (NORMODYNE,TRANDATE) injection 20-80 mg  . DISCONTD: hydrALAZINE (APRESOLINE) injection 10 mg  . DISCONTD: lactated ringers infusion  . betamethasone acetate-betamethasone sodium phosphate (CELESTONE) injection 12 mg  . DISCONTD: acetaminophen (TYLENOL) tablet 650 mg  . DISCONTD: zolpidem (AMBIEN) tablet 5 mg  . DISCONTD: docusate sodium (COLACE) capsule 100 mg  . DISCONTD: calcium carbonate (TUMS - dosed in mg elemental calcium) chewable tablet 400 mg of elemental calcium  .  DISCONTD: prenatal multivitamin tablet 1 tablet  . betamethasone acetate-betamethasone sodium phosphate (CELESTONE) injection 12 mg  . DISCONTD: lactated ringers infusion  . DISCONTD: oxytocin (PITOCIN) IV BOLUS FROM BAG  . DISCONTD: oxytocin (PITOCIN) IV infusion 40 units in LR 1000 mL - Premix  . DISCONTD: lactated ringers infusion 500-1,000 mL  . DISCONTD: oxyCODONE-acetaminophen (PERCOCET/ROXICET) 5-325 MG per tablet 1 tablet  . DISCONTD: oxyCODONE-acetaminophen (PERCOCET/ROXICET) 5-325 MG per tablet 2 tablet  . DISCONTD: sodium citrate-citric acid (ORACIT) solution 30 mL  . DISCONTD: lidocaine (PF) (XYLOCAINE) 1 % injection 30 mL  . DISCONTD: terbutaline (BRETHINE) injection 0.25 mg  . DISCONTD: misoprostol (CYTOTEC) tablet 25 mcg  . DISCONTD: penicillin G potassium 5 Million Units in dextrose 5 % 250 mL IVPB    Order Specific Question:   Antibiotic Indication:    Answer:   Group B Strep Prophylaxis  . DISCONTD: penicillin G potassium 3 Million Units in dextrose 50mL IVPB    Order Specific Question:   Antibiotic Indication:    Answer:   Group B Strep Prophylaxis  . DISCONTD: promethazine (PHENERGAN) injection 25 mg  . famotidine (PEPCID) IVPB 20 mg premix  . DISCONTD: butorphanol (STADOL) injection 1 mg  . DISCONTD: penicillin G potassium 5 Million Units in dextrose 5 % 250 mL IVPB    Order Specific Question:   Antibiotic Indication:    Answer:   Group B Strep Prophylaxis  . DISCONTD: penicillin G potassium 3 Million Units in dextrose 50mL IVPB    Order Specific Question:   Antibiotic Indication:    Answer:   Group B Strep Prophylaxis  . DISCONTD: labetalol (NORMODYNE,TRANDATE) injection 20-80 mg  . DISCONTD: hydrALAZINE (APRESOLINE) injection 10 mg  . magnesium bolus via infusion 4 g  . magnesium sulfate 40 grams in LR 500 mL OB infusion  . DISCONTD: labetalol (NORMODYNE) tablet 200 mg  . DISCONTD: ondansetron (ZOFRAN) injection 4 mg  . DISCONTD: terbutaline (BRETHINE)  injection 0.25 mg  . DISCONTD: oxytocin (PITOCIN) IV infusion 40 units in LR 1000 mL - Premix    Order Specific Question:   Begin infusion at:    Answer:   2 milli-units/min (3 mL/hr)    Order Specific Question:   Increase infusion by:    Answer:   2 milli-units/min (3  mL/hr)  . DISCONTD: penicillin G potassium 5 Million Units in dextrose 5 % 250 mL IVPB    Order Specific Question:   Antibiotic Indication:    Answer:   Group B Strep Prophylaxis  . DISCONTD: penicillin G potassium 3 Million Units in dextrose 50mL IVPB    Order Specific Question:   Antibiotic Indication:    Answer:   Group B Strep Prophylaxis  . DISCONTD: oxytocin (PITOCIN) IV infusion 40 units in LR 1000 mL - Premix    Order Specific Question:   Begin infusion at:    Answer:   1 milli-unit/min (1.5 mL/hr)    Order Specific Question:   Increase infusion by:    Answer:   1 milli-unit/min (1.5 mL/hr)  . DISCONTD: ePHEDrine injection 10 mg  . DISCONTD: PHENYLephrine 40 mcg/ml in normal saline Adult IV Push Syringe  . lactated ringers infusion 500 mL  . DISCONTD: fentaNYL 2.5 mcg/ml w/bupivacaine 0.1% in NS epidural infusion (WH-ANES)  . DISCONTD: diphenhydrAMINE (BENADRYL) injection 12.5 mg  . DISCONTD: ePHEDrine injection 10 mg  . DISCONTD: PHENYLephrine 40 mcg/ml in normal saline Adult IV Push Syringe  . DISCONTD: ketorolac (TORADOL) 30 MG/ML injection 30 mg  . DISCONTD: ketorolac (TORADOL) 30 MG/ML injection 30 mg  . DISCONTD: naloxone (NARCAN) injection 0.4 mg  . DISCONTD: sodium chloride flush (NS) 0.9 % injection 3 mL  . DISCONTD: ondansetron (ZOFRAN) injection 4 mg  . DISCONTD: nalbuphine (NUBAIN) injection 5 mg  . DISCONTD: nalbuphine (NUBAIN) injection 5 mg  . DISCONTD: nalbuphine (NUBAIN) injection 5 mg  . DISCONTD: nalbuphine (NUBAIN) injection 5 mg  . DISCONTD: diphenhydrAMINE (BENADRYL) injection 12.5 mg  . DISCONTD: diphenhydrAMINE (BENADRYL) capsule 25 mg  . DISCONTD: naloxone HCl (NARCAN) 2 mg in  dextrose 5 % 250 mL infusion  . DISCONTD: meperidine (DEMEROL) injection 6.25 mg  . DISCONTD: fentaNYL (SUBLIMAZE) injection 25-50 mcg  . DISCONTD: metoCLOPramide (REGLAN) injection 10 mg  . DISCONTD: sodium chloride irrigation 0.9 %  . oxytocin (PITOCIN) IV infusion 40 units in LR 1000 mL - Premix  . DISCONTD: lactated ringers infusion  . DISCONTD: ibuprofen (ADVIL,MOTRIN) tablet 600 mg  . DISCONTD: acetaminophen (TYLENOL) tablet 650 mg  . DISCONTD: zolpidem (AMBIEN) tablet 5 mg  . DISCONTD: diphenhydrAMINE (BENADRYL) capsule 25 mg  . DISCONTD: senna-docusate (Senokot-S) tablet 2 tablet  . DISCONTD: simethicone (MYLICON) chewable tablet 80 mg  . DISCONTD: simethicone (MYLICON) chewable tablet 80 mg  . DISCONTD: simethicone (MYLICON) chewable tablet 80 mg  . DISCONTD: prenatal multivitamin tablet 1 tablet  . DISCONTD: menthol-cetylpyridinium (CEPACOL) lozenge 3 mg  . DISCONTD: witch hazel-glycerin (TUCKS) pad 1 application  . DISCONTD: dibucaine (NUPERCAINAL) 1 % rectal ointment 1 application  . DISCONTD: coconut oil  . DISCONTD: Tdap (BOOSTRIX) injection 0.5 mL  . DISCONTD: oxyCODONE-acetaminophen (PERCOCET/ROXICET) 5-325 MG per tablet 1 tablet  . DISCONTD: oxyCODONE-acetaminophen (PERCOCET/ROXICET) 5-325 MG per tablet 2 tablet  . ketorolac (TORADOL) 30 MG/ML injection    Suzie Portela, [place]  : cabinet override  . DISCONTD: ketorolac (TORADOL) 30 MG/ML injection    Poore, Elizabeth   : cabinet override  . oxyCODONE-acetaminophen (PERCOCET/ROXICET) 5-325 MG tablet    Sig: Take 2 tablets by mouth every 4 (four) hours as needed (pain scale > 7).    Dispense:  30 tablet    Refill:  0   NST reviewed- reactive  Consult Dr Henderson Cloud with exam findings and test results. Recommends admission to antenatal for observation. Repeat labs tomorrow with second BMZ shot.  Treatments in MAU included 20 mg Labetalol with LR at IV- hypertension resolved with one dose of medication.      Assessment: 1. Chronic hypertension during pregnancy, antepartum   2. Cesarean delivery, delivered, current hospitalization    Plan: Admit to Antenatal for observation  Care transferred to Dr Henderson Cloud for management  Orders placed for observation    Steward Drone Certified Nurse-Midwife 02/26/2017 8:30 AM

## 2018-04-27 ENCOUNTER — Encounter (HOSPITAL_COMMUNITY): Payer: Self-pay | Admitting: Emergency Medicine

## 2018-04-27 ENCOUNTER — Ambulatory Visit (HOSPITAL_COMMUNITY)
Admission: EM | Admit: 2018-04-27 | Discharge: 2018-04-27 | Disposition: A | Discharge status: Home / Self Care | Payer: Medicaid Other | Attending: Family Medicine | Admitting: Family Medicine

## 2018-04-27 ENCOUNTER — Other Ambulatory Visit: Payer: Self-pay

## 2018-04-27 DIAGNOSIS — I10 Essential (primary) hypertension: Secondary | ICD-10-CM

## 2018-04-27 DIAGNOSIS — J020 Streptococcal pharyngitis: Secondary | ICD-10-CM

## 2018-04-27 LAB — POCT RAPID STREP A: Streptococcus, Group A Screen (Direct): POSITIVE — AB

## 2018-04-27 MED ORDER — PENICILLIN G BENZATHINE 1200000 UNIT/2ML IM SUSP
INTRAMUSCULAR | Status: AC
  Filled 2018-04-27: qty 2

## 2018-04-27 MED ORDER — PENICILLIN G BENZATHINE 1200000 UNIT/2ML IM SUSP
1.2000 10*6.[IU] | Freq: Once | INTRAMUSCULAR | Status: AC
  Administered 2018-04-27: 1.2 10*6.[IU] via INTRAMUSCULAR

## 2018-04-27 NOTE — ED Triage Notes (Signed)
Pt sts sore throat and fever with hx of strep in past

## 2018-04-27 NOTE — ED Provider Notes (Signed)
[facility]    CSN: [id number] Arrival date & time: 04/27/18  1640     History   Chief Complaint Chief Complaint  Patient presents with  . Sore Throat  . Fever    HPI Anna [name] is a 38 y.o. female.   Anna [name] presents with complaints of sore throat and fever which started two days ago, worse yesterday. Has had frequent strep in her lifetime, unsuccessful tonsillectomy during childhood. Has some cough and congestion. No known ill contacts. No rash. Mild nausea. Has been taking OTC medications. Last tylenol this am. Hx of anxiety, htn, sti's.    ROS per HPI, negative if not otherwise mentioned.      Past Medical History:  Diagnosis Date  . Anxiety   . Chlamydia   . Depression   . Hypertension   . Malignant hyperthermia    PT STATES DX WITH MALIGNANT HYPERTHERMIA WHEN SHE HAD ADNOIDECTOMY AT AGE 53-DID WEAR ALERT BRACELET AS A CHILD-BUT NO LONGER WEARS.  Marland Kitchen Trichomoniasis 2018    Patient Active Problem List   Diagnosis Date Noted  . Cesarean delivery, delivered, current hospitalization 02/22/2017  . Chronic hypertension during pregnancy, antepartum 02/19/2017  . Indication for care in labor and delivery, antepartum 02/19/2017    Past Surgical History:  Procedure Laterality Date  . ADENOIDECTOMY AT AGE 53     surgery never completed-because of malignant hyperthermia complications  . CESAREAN SECTION N/A 02/22/2017   Procedure: CESAREAN SECTION;  Surgeon: Waynard Reeds, MD;  Location: [facility] BIRTHING SUIT[facility];  Service: Obstetrics;  Laterality: N/A;  . CHOLECYSTECTOMY  05/16/2011   Procedure: LAPAROSCOPIC CHOLECYSTECTOMY WITH INTRAOPERATIVE CHOLANGIOGRAM;  Surgeon: Clovis Pu. Cornett, MD;  Location: [facility];  Service: General;  Laterality: N/A;  Laparoscopic Cholecystectomy and Cholangiogram   . wisdom teeth extracted  2000   pt states surgery was done at [facility]-BECAUSE OF HER PAST HX MALIGNANT HYPERTHERMIA-SURGERY BY DR. Warren Danes    OB History    Gravida   3   Para  1   Term      Preterm  1   AB  2   Living  1     SAB  1   TAB  1   Ectopic      Multiple  0   Live Births  1            Home Medications    Prior to Admission medications   Medication Sig Start Date End Date Taking? Authorizing Provider  oxyCODONE-acetaminophen (PERCOCET/ROXICET) 5-325 MG tablet Take 2 tablets by mouth every 4 (four) hours as needed (pain scale > 7). 02/25/17   Philip Aspen, DO  Prenatal Vit w/Fe-Methylfol-FA (PNV PO) Take 1 tablet by mouth daily.     [provider]    Family History History reviewed. No pertinent family history.  Social History Social History   Tobacco Use  . Smoking status: Former Smoker    Years: 7.00    Types: Cigars    Last attempt to quit: 07/16/2016    Years since quitting: 1.7  . Smokeless tobacco: Never Used  . Tobacco comment: 7 CIGARS IN A PACK -SMOKES ONE PACK DAILY  Substance Use Topics  . Alcohol use: Yes    Comment: RARELY  . Drug use: No     Allergies   Anectine [succinylcholine chloride]   Review of Systems Review of Systems   Physical Exam Triage Vital Signs ED Triage Vitals  Enc Vitals Group  BP 04/27/18 1653 (!) 166/97     Pulse Rate 04/27/18 1653 (!) 102     Resp 04/27/18 1653 16     Temp 04/27/18 1653 99.1 F (37.3 C)     Temp Source 04/27/18 1653 Oral     SpO2 04/27/18 1653 99 %     Weight --      Height --      Head Circumference --      Peak Flow --      Pain Score 04/27/18 1701 6     Pain Loc --      Pain Edu? --      Excl. in GC? --    No data found.  Updated Vital Signs BP (!) 166/97 (BP Location: Left Wrist)   Pulse (!) 102   Temp 99.1 F (37.3 C) (Oral)   Resp 16   SpO2 99%    Physical Exam Constitutional:      General: She is not in acute distress.    Appearance: She is well-developed.  HENT:     Head: Normocephalic and atraumatic.     Right Ear: Tympanic membrane, ear canal and external ear normal.     Left Ear: Tympanic  membrane, ear canal and external ear normal.     Nose: Nose normal.     Mouth/Throat:     Pharynx: Uvula midline.     Tonsils: Tonsillar exudate present. 2+ on the right. 2+ on the left.  Eyes:     Conjunctiva/sclera: Conjunctivae normal.     Pupils: Pupils are equal, round, and reactive to light.  Cardiovascular:     Rate and Rhythm: Normal rate and regular rhythm.     Heart sounds: Normal heart sounds.  Pulmonary:     Effort: Pulmonary effort is normal.     Breath sounds: Normal breath sounds.  Lymphadenopathy:     Cervical: Cervical adenopathy present.  Skin:    General: Skin is warm and dry.  Neurological:     Mental Status: She is alert and oriented to person, place, and time.      UC Treatments / Results  Labs (all labs ordered are listed, but only abnormal results are displayed) Labs Reviewed  POCT RAPID STREP A - Abnormal; Notable for the following components:      Result Value   Streptococcus, Group A Screen (Direct) POSITIVE (*)    All other components within normal limits    EKG None  Radiology No results found.  Procedures Procedures (including critical care time)  Medications Ordered in UC Medications  penicillin g benzathine (BICILLIN LA) 1200000 UNIT/2ML injection 1.2 Million Units (has no administration in time range)    Initial Impression / Assessment and Plan / UC Course  I have reviewed the triage vital signs and the nursing notes.  Pertinent labs & imaging results that were available during my care of the patient were reviewed by me and considered in my medical decision making (see chart for details).     Positive rapid strep consistent with H&P. Bicillin provided today. Return precautions provided. Patient verbalized understanding and agreeable to plan.    Final Clinical Impressions(s) / UC Diagnoses   Final diagnoses:  Strep pharyngitis     Discharge Instructions     Throat lozenges, gargles, chloraseptic spray, warm teas,  popsicles etc to help with throat pain.   Considered contagious for 24 hours once antibiotics are started, change out toothbrush in 24 hours.   If symptoms worsen or do  not improve in the next week to return to be seen or to follow up with your PCP.      ED Prescriptions    None     Controlled Substance Prescriptions [place] Controlled Substance Registry consulted? Not Applicable   Georgetta Haber, NP 04/27/18 [phone]

## 2018-04-27 NOTE — Discharge Instructions (Signed)
Throat lozenges, gargles, chloraseptic spray, warm teas, popsicles etc to help with throat pain.   Considered contagious for 24 hours once antibiotics are started, change out toothbrush in 24 hours.   If symptoms worsen or do not improve in the next week to return to be seen or to follow up with your PCP.

## 2020-02-01 ENCOUNTER — Encounter (HOSPITAL_COMMUNITY): Payer: Self-pay | Admitting: Psychiatry

## 2020-02-01 ENCOUNTER — Telehealth (INDEPENDENT_AMBULATORY_CARE_PROVIDER_SITE_OTHER): Payer: Medicaid Other | Admitting: Psychiatry

## 2020-02-01 DIAGNOSIS — F341 Dysthymic disorder: Secondary | ICD-10-CM | POA: Diagnosis not present

## 2020-02-01 DIAGNOSIS — F411 Generalized anxiety disorder: Secondary | ICD-10-CM

## 2020-02-01 DIAGNOSIS — F4310 Post-traumatic stress disorder, unspecified: Secondary | ICD-10-CM

## 2020-02-01 MED ORDER — FLUOXETINE HCL 40 MG PO CAPS: 40.0000 mg | ORAL_CAPSULE | Freq: Every day | ORAL | 0 refills | Status: DC

## 2020-02-01 MED ORDER — BUSPIRONE HCL 10 MG PO TABS: 10.0000 mg | ORAL_TABLET | Freq: Three times a day (TID) | ORAL | 0 refills | Status: DC
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
## 2020-02-09 ENCOUNTER — Ambulatory Visit (INDEPENDENT_AMBULATORY_CARE_PROVIDER_SITE_OTHER): Payer: Medicaid Other | Admitting: Licensed Clinical Social Worker

## 2020-02-09 DIAGNOSIS — F4310 Post-traumatic stress disorder, unspecified: Secondary | ICD-10-CM | POA: Diagnosis not present
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
## 2020-02-11 ENCOUNTER — Ambulatory Visit (HOSPITAL_COMMUNITY): Payer: Self-pay | Admitting: Licensed Clinical Social Worker

## 2020-02-16 DIAGNOSIS — J020 Streptococcal pharyngitis: Secondary | ICD-10-CM | POA: Insufficient documentation

## 2020-02-16 DIAGNOSIS — U071 COVID-19: Secondary | ICD-10-CM | POA: Insufficient documentation

## 2020-02-25 ENCOUNTER — Ambulatory Visit (INDEPENDENT_AMBULATORY_CARE_PROVIDER_SITE_OTHER): Payer: Medicaid Other | Admitting: Licensed Clinical Social Worker

## 2020-02-25 DIAGNOSIS — F4310 Post-traumatic stress disorder, unspecified: Secondary | ICD-10-CM | POA: Diagnosis not present
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
## 2020-03-03 ENCOUNTER — Ambulatory Visit (HOSPITAL_COMMUNITY): Payer: Medicaid Other | Admitting: Licensed Clinical Social Worker

## 2020-03-04 ENCOUNTER — Telehealth (HOSPITAL_COMMUNITY): Payer: Medicaid Other | Admitting: Psychiatry

## 2020-03-11 ENCOUNTER — Encounter (HOSPITAL_COMMUNITY): Payer: Self-pay | Admitting: Psychiatry

## 2020-03-11 ENCOUNTER — Telehealth (INDEPENDENT_AMBULATORY_CARE_PROVIDER_SITE_OTHER): Payer: Medicaid Other | Admitting: Psychiatry

## 2020-03-11 DIAGNOSIS — F4310 Post-traumatic stress disorder, unspecified: Secondary | ICD-10-CM | POA: Diagnosis not present

## 2020-03-11 DIAGNOSIS — F341 Dysthymic disorder: Secondary | ICD-10-CM

## 2020-03-11 DIAGNOSIS — F411 Generalized anxiety disorder: Secondary | ICD-10-CM | POA: Diagnosis not present

## 2020-03-11 MED ORDER — BUSPIRONE HCL 10 MG PO TABS: 10.0000 mg | ORAL_TABLET | Freq: Three times a day (TID) | ORAL | 2 refills | Status: DC

## 2020-03-11 MED ORDER — FLUOXETINE HCL 40 MG PO CAPS: 40.0000 mg | ORAL_CAPSULE | Freq: Every day | ORAL | 2 refills | Status: DC
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
## 2020-03-17 ENCOUNTER — Ambulatory Visit (INDEPENDENT_AMBULATORY_CARE_PROVIDER_SITE_OTHER): Payer: Medicaid Other | Admitting: Licensed Clinical Social Worker

## 2020-03-17 DIAGNOSIS — F4310 Post-traumatic stress disorder, unspecified: Secondary | ICD-10-CM | POA: Diagnosis not present
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
## 2020-03-24 ENCOUNTER — Ambulatory Visit (INDEPENDENT_AMBULATORY_CARE_PROVIDER_SITE_OTHER): Payer: Medicaid Other | Admitting: Licensed Clinical Social Worker

## 2020-03-24 DIAGNOSIS — F4310 Post-traumatic stress disorder, unspecified: Secondary | ICD-10-CM | POA: Diagnosis not present
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
## 2020-04-21 ENCOUNTER — Other Ambulatory Visit: Payer: Self-pay

## 2020-04-21 ENCOUNTER — Ambulatory Visit (INDEPENDENT_AMBULATORY_CARE_PROVIDER_SITE_OTHER): Payer: Medicaid Other | Admitting: Licensed Clinical Social Worker

## 2020-04-21 DIAGNOSIS — F411 Generalized anxiety disorder: Secondary | ICD-10-CM
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
## 2020-05-05 ENCOUNTER — Ambulatory Visit (INDEPENDENT_AMBULATORY_CARE_PROVIDER_SITE_OTHER): Payer: Medicaid Other | Admitting: Licensed Clinical Social Worker

## 2020-05-05 DIAGNOSIS — F411 Generalized anxiety disorder: Secondary | ICD-10-CM
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
## 2020-05-19 ENCOUNTER — Ambulatory Visit (INDEPENDENT_AMBULATORY_CARE_PROVIDER_SITE_OTHER): Payer: Medicaid Other | Admitting: Licensed Clinical Social Worker

## 2020-05-19 DIAGNOSIS — F411 Generalized anxiety disorder: Secondary | ICD-10-CM | POA: Diagnosis not present
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
## 2020-06-02 ENCOUNTER — Ambulatory Visit (INDEPENDENT_AMBULATORY_CARE_PROVIDER_SITE_OTHER): Payer: Medicaid Other | Admitting: Licensed Clinical Social Worker

## 2020-06-02 DIAGNOSIS — F411 Generalized anxiety disorder: Secondary | ICD-10-CM
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
## 2020-06-06 ENCOUNTER — Other Ambulatory Visit (HOSPITAL_COMMUNITY): Payer: Self-pay

## 2020-06-06 MED ORDER — FLUOXETINE HCL 40 MG PO CAPS: 40.0000 mg | ORAL_CAPSULE | Freq: Every day | ORAL | 0 refills | Status: AC

## 2020-06-10 ENCOUNTER — Telehealth (HOSPITAL_COMMUNITY): Payer: Medicaid Other | Admitting: Psychiatry

## 2020-06-21 ENCOUNTER — Other Ambulatory Visit (HOSPITAL_COMMUNITY): Payer: Self-pay | Admitting: Psychiatry

## 2020-10-10 ENCOUNTER — Other Ambulatory Visit (HOSPITAL_COMMUNITY): Payer: Self-pay | Admitting: Psychiatry

## 2020-12-21 ENCOUNTER — Ambulatory Visit (INDEPENDENT_AMBULATORY_CARE_PROVIDER_SITE_OTHER): Payer: Medicaid Other | Admitting: Licensed Clinical Social Worker

## 2020-12-21 DIAGNOSIS — F411 Generalized anxiety disorder: Secondary | ICD-10-CM | POA: Diagnosis not present
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
## 2021-01-12 ENCOUNTER — Ambulatory Visit (HOSPITAL_COMMUNITY): Payer: Medicaid Other | Admitting: Licensed Clinical Social Worker

## 2021-01-31 ENCOUNTER — Ambulatory Visit (INDEPENDENT_AMBULATORY_CARE_PROVIDER_SITE_OTHER): Payer: Medicaid Other | Admitting: Licensed Clinical Social Worker

## 2021-01-31 DIAGNOSIS — F411 Generalized anxiety disorder: Secondary | ICD-10-CM
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
## 2021-11-13 ENCOUNTER — Inpatient Hospital Stay: Payer: Medicaid Other | Attending: Oncology | Admitting: Oncology

## 2021-11-13 ENCOUNTER — Encounter: Payer: Self-pay | Admitting: Oncology

## 2021-11-13 ENCOUNTER — Inpatient Hospital Stay: Payer: Medicaid Other

## 2021-11-13 DIAGNOSIS — N938 Other specified abnormal uterine and vaginal bleeding: Secondary | ICD-10-CM | POA: Diagnosis not present

## 2021-11-13 DIAGNOSIS — Z87891 Personal history of nicotine dependence: Secondary | ICD-10-CM | POA: Insufficient documentation

## 2021-11-13 DIAGNOSIS — E559 Vitamin D deficiency, unspecified: Secondary | ICD-10-CM | POA: Insufficient documentation

## 2021-11-13 DIAGNOSIS — E538 Deficiency of other specified B group vitamins: Secondary | ICD-10-CM | POA: Insufficient documentation

## 2021-11-13 DIAGNOSIS — D500 Iron deficiency anemia secondary to blood loss (chronic): Secondary | ICD-10-CM | POA: Diagnosis not present

## 2021-11-13 DIAGNOSIS — E611 Iron deficiency: Secondary | ICD-10-CM | POA: Diagnosis present

## 2021-11-13 DIAGNOSIS — Z803 Family history of malignant neoplasm of breast: Secondary | ICD-10-CM | POA: Diagnosis not present

## 2021-11-13 DIAGNOSIS — I10 Essential (primary) hypertension: Secondary | ICD-10-CM | POA: Insufficient documentation

## 2021-11-13 LAB — CBC WITH DIFFERENTIAL (CANCER CENTER ONLY)
Abs Immature Granulocytes: 0.01 10*3/uL (ref 0.00–0.07)
Basophils Absolute: 0 10*3/uL (ref 0.0–0.1)
Basophils Relative: 1 %
Eosinophils Absolute: 0.1 10*3/uL (ref 0.0–0.5)
Eosinophils Relative: 1 %
HCT: 42.1 % (ref 36.0–46.0)
Hemoglobin: 13.4 g/dL (ref 12.0–15.0)
Immature Granulocytes: 0 %
Lymphocytes Relative: 24 %
Lymphs Abs: 1.8 10*3/uL (ref 0.7–4.0)
MCH: 26.6 pg (ref 26.0–34.0)
MCHC: 31.8 g/dL (ref 30.0–36.0)
MCV: 83.5 fL (ref 80.0–100.0)
Monocytes Absolute: 0.5 10*3/uL (ref 0.1–1.0)
Monocytes Relative: 7 %
Neutro Abs: 5.1 10*3/uL (ref 1.7–7.7)
Neutrophils Relative %: 67 %
Platelet Count: 287 10*3/uL (ref 150–400)
RBC: 5.04 MIL/uL (ref 3.87–5.11)
RDW: 15.1 % (ref 11.5–15.5)
WBC Count: 7.6 10*3/uL (ref 4.0–10.5)
nRBC: 0 % (ref 0.0–0.2)

## 2021-11-13 LAB — APTT: aPTT: 35 seconds (ref 24–36)

## 2021-11-13 LAB — PROTIME-INR
INR: 1.2 (ref 0.8–1.2)
Prothrombin Time: 15.1 seconds (ref 11.4–15.2)

## 2021-11-13 LAB — FERRITIN: Ferritin: 47 ng/mL (ref 11–307)

## 2021-11-13 LAB — FIBRINOGEN: Fibrinogen: 395 mg/dL (ref 210–475)

## 2021-11-13 NOTE — Progress Notes (Signed)
[facility] Initial Visit:  Patient Care Team: Eber Jones, NP as PCP - General (Family Medicine)  CHIEF COMPLAINTS/PURPOSE OF CONSULTATION:  Oncology History   No history exists.    HISTORY OF PRESENTING ILLNESS: Anna [name] 41 y.o. female is here because of possible iron deficiency Medical history notable for hypertension, B12 deficiency, malignant hyperthermia, vitamin D deficiency, morbid obesity  September 11, 2021: WBC 7.5 hemoglobin 12.3 platelet count 274 MCV 83; 68% segs 22% lymphs 7 monos 2 eos 1 basophil iron saturation 13% B12 317  November 13 2021:  [facility] Hematology Consult Patient states that she has no prior history of anemia.  Was placed on oral iron x 2 yrs.  No history of blood transfusion or receiving IV iron Has not undergone endoscopy.   Patient is G1 P1 with last pregnancy delivered 4 yrs ago by C section.  Did not require reoperation or transfusion following delivery. Menses occur monthly, last 5 to 7 days.  Bleeding varies from medium to heavy.  No bleeding between periods.  Has not been diagnosed with dysfunctional uterine bleeding.  Has been on OCP's but this was not to regulate menses. No history of intra-articular or intramuscular bleeding.  Does not take NSAIDS and is not on anticoagulants No cravings for ice Taking iron sulfate 325 mg daily which she is tolerating well.  Gynecologic care is via her NP.    Social:  Works at [employer].  Quit smoking cigarettes 6 yrs ago.  EtOH 2 drinks twice weekly   Review of Systems - Oncology  MEDICAL HISTORY: Past Medical History:  Diagnosis Date   Anxiety    B12 deficiency    Chlamydia    Depression    Hypertension    Malignant hyperthermia    PT STATES DX WITH MALIGNANT HYPERTHERMIA WHEN SHE HAD ADNOIDECTOMY AT AGE 37-DID WEAR ALERT BRACELET AS A CHILD-BUT NO LONGER WEARS.   Trichomoniasis 2018   Vitamin D deficiency     SURGICAL HISTORY: Past Surgical History:  Procedure  Laterality Date   CESAREAN SECTION N/A 02/22/2017   Procedure: CESAREAN SECTION;  Surgeon: Waynard Reeds, MD;  Location: [facility] BIRTHING SUITES;  Service: Obstetrics;  Laterality: N/A;   CHOLECYSTECTOMY  05/16/2011   Procedure: LAPAROSCOPIC CHOLECYSTECTOMY WITH INTRAOPERATIVE CHOLANGIOGRAM;  Surgeon: Clovis Pu. Cornett, MD;  Location: [facility] ORS;  Service: General;  Laterality: N/A;  Laparoscopic Cholecystectomy and Cholangiogram    wisdom teeth extracted  01/22/1998   pt states surgery was done at [facility]-BECAUSE OF HER PAST HX MALIGNANT HYPERTHERMIA-SURGERY BY DR. Warren Danes    SOCIAL HISTORY: Social History   Socioeconomic History   Marital status: Single    Spouse name: Not on file   Number of children: 1   Years of education: 14   Highest education level: Associate degree: academic program  Occupational History   Occupation: LPN  Tobacco Use   Smoking status: Former    Types: Cigars    Quit date: 07/16/2016    Years since quitting: 5.3   Smokeless tobacco: Never   Tobacco comments:    7 CIGARS IN A PACK -SMOKES ONE PACK DAILY  Vaping Use   Vaping Use: Never used  Substance and Sexual Activity   Alcohol use: Yes    Comment: RARELY   Drug use: No   Sexual activity: Yes    Birth control/protection: None  Other Topics Concern   Not on file  Social History Narrative   Not on file  Social Determinants of Health   Financial Resource Strain: Not on file  Food Insecurity: Not on file  Transportation Needs: Not on file  Physical Activity: Not on file  Stress: Not on file  Social Connections: Not on file  Intimate Partner Violence: Not on file    FAMILY HISTORY Family History  Problem Relation Age of Onset   Hypertension Mother    Breast cancer Mother    Heart disease Mother    Hypertension Father    Diabetes Father    Gout Father    Diabetes Maternal Grandmother    Diabetes Maternal Grandfather     ALLERGIES:  is allergic to anectine [succinylcholine  chloride].  MEDICATIONS:  Current Outpatient Medications  Medication Sig Dispense Refill   amLODipine (NORVASC) 10 MG tablet Take 10 mg by mouth daily.     FLUoxetine (PROZAC) 40 MG capsule Take 1 capsule (40 mg total) by mouth daily. 90 capsule 0   losartan (COZAAR) 100 MG tablet Take 100 mg by mouth daily.     No current facility-administered medications for this visit.    PHYSICAL EXAMINATION:  ECOG PERFORMANCE STATUS: 1 - Symptomatic but completely ambulatory   Vitals:   11/13/21 1132  BP: 132/72  Pulse: 82  Resp: 16  Temp: 97.7 F (36.5 C)  SpO2: 95%    Filed Weights   11/13/21 1132  Weight: (!) 338 lb 11.2 oz (153.6 kg)     Physical Exam Vitals and nursing note reviewed.  Constitutional:      Appearance: Normal appearance. She is not toxic-appearing or diaphoretic.     Comments: Here with mother.    HENT:     Head: Normocephalic and atraumatic.     Right Ear: External ear normal.     Left Ear: External ear normal.     Nose: Nose normal. No congestion or rhinorrhea.  Eyes:     General: No scleral icterus.    Extraocular Movements: Extraocular movements intact.     Conjunctiva/sclera: Conjunctivae normal.     Pupils: Pupils are equal, round, and reactive to light.  Cardiovascular:     Rate and Rhythm: Normal rate.     Heart sounds: No murmur heard.    No friction rub. No gallop.  Abdominal:     General: Bowel sounds are normal.     Palpations: Abdomen is soft.  Musculoskeletal:        General: No swelling, tenderness or deformity.     Cervical back: Normal range of motion and neck supple. No rigidity or tenderness.  Lymphadenopathy:     Head:     Right side of head: No submental, submandibular, tonsillar, preauricular, posterior auricular or occipital adenopathy.     Left side of head: No submental, submandibular, tonsillar, preauricular, posterior auricular or occipital adenopathy.     Cervical: No cervical adenopathy.     Right cervical: No  superficial, deep or posterior cervical adenopathy.    Left cervical: No superficial, deep or posterior cervical adenopathy.     Upper Body:     Right upper body: No supraclavicular, axillary, pectoral or epitrochlear adenopathy.     Left upper body: No supraclavicular, axillary, pectoral or epitrochlear adenopathy.  Skin:    General: Skin is warm.     Coloration: Skin is not jaundiced.  Neurological:     General: No focal deficit present.     Mental Status: She is alert and oriented to person, place, and time. Mental status is at baseline.  Cranial Nerves: No cranial nerve deficit.  Psychiatric:        Mood and Affect: Mood normal.        Behavior: Behavior normal.        Thought Content: Thought content normal.        Judgment: Judgment normal.     LABORATORY DATA: I have personally reviewed the data as listed:  Clinical Support on 11/13/2021  Component Date Value Ref Range Status   Coagulation Factor VIII 11/13/2021 109  56 - 140 % Final   Ristocetin Co-factor, Plasma 11/13/2021 86  50 - 200 % Final   Comment: (NOTE) Performed At: [facility] [address] [place], [place] [id number] Jolene Schimke MD UD:[id number]    Von Willebrand Antigen, Plasma 11/13/2021 98  50 - 200 % Final   Comment: (NOTE) This test was developed and its performance characteristics determined by [facility]. It has not been cleared or approved by the Food and Drug Administration.    Fibrinogen 11/13/2021 395  210 - 475 mg/dL Final   Comment: (NOTE) Fibrinogen results may be underestimated in patients receiving thrombolytic therapy. Performed at [facility], [address] [place], [place] [address]    Prothrombin Time 11/13/2021 15.1  11.4 - 15.2 seconds Final   INR 11/13/2021 1.2  0.8 - 1.2 Final   Comment: (NOTE) INR goal varies based on device and disease states. Performed at [facility], [address] [place], [place] [address]     aPTT 11/13/2021 35  24 - 36 seconds Final   Performed at [facility], [address] [place], [place] [address]   Ferritin 11/13/2021 47  11 - 307 ng/mL Final   Performed at [facility], [address], [place], [place] [address]   WBC Count 11/13/2021 7.6  4.0 - 10.5 K/uL Final   RBC 11/13/2021 5.04  3.87 - 5.11 MIL/uL Final   Hemoglobin 11/13/2021 13.4  12.0 - 15.0 g/dL Final   HCT 94/70/9628 42.1  36.0 - 46.0 % Final   MCV 11/13/2021 83.5  80.0 - 100.0 fL Final   MCH 11/13/2021 26.6  26.0 - 34.0 pg Final   MCHC 11/13/2021 31.8  30.0 - 36.0 g/dL Final   RDW 36/62/9476 15.1  11.5 - 15.5 % Final   Platelet Count 11/13/2021 287  150 - 400 K/uL Final   nRBC 11/13/2021 0.0  0.0 - 0.2 % Final   Neutrophils Relative % 11/13/2021 67  % Final   Neutro Abs 11/13/2021 5.1  1.7 - 7.7 K/uL Final   Lymphocytes Relative 11/13/2021 24  % Final   Lymphs Abs 11/13/2021 1.8  0.7 - 4.0 K/uL Final   Monocytes Relative 11/13/2021 7  % Final   Monocytes Absolute 11/13/2021 0.5  0.1 - 1.0 K/uL Final   Eosinophils Relative 11/13/2021 1  % Final   Eosinophils Absolute 11/13/2021 0.1  0.0 - 0.5 K/uL Final   Basophils Relative 11/13/2021 1  % Final   Basophils Absolute 11/13/2021 0.0  0.0 - 0.1 K/uL Final   Immature Granulocytes 11/13/2021 0  % Final   Abs Immature Granulocytes 11/13/2021 0.01  0.00 - 0.07 K/uL Final   Performed at [facility], [address] [place], [place] [address]   Interpretation 11/13/2021 Note   Final   Comment: (NOTE) ------------------------------- COAGULATION: VON WILLEBRAND FACTOR ASSESSMENT CURRENT RESULTS ASSESSMENT The VWF:Ag is normal. The VWF:RCo is normal. The FVIII is normal. VON WILLEBRAND FACTOR ASSESSMENT  CURRENT RESULTS INTERPRETATION - These results are not consistent with a diagnosis of VWD according to the current NHLBI guideline. VON WILLEBRAND FACTOR ASSESSMENT - Results may be falsely elevated  and possibly falsely normal as VWF and FVIII may increase in pregnancy, in samples drawn from patients (particularly children) who are visibly stressed at the time of phlebotomy, as acute phase reactants, or in response to certain drug therapies such as desmopressin. Repeat testing may be necessary before excluding a diagnosis of VWD especially if the clinical suspicion is high for an underlying bleeding disorder. The setting for phlebotomy should be as calm as possible and patients should be encouraged to sit quietly prior to the blood draw. VON WILLEBRAND FA                          CTOR ASSESSMENT DEFINITIONS - VWD - von Willebrand disease; VWF - von Willebrand factor; VWF:Ag - VWF antigen; VWF:RCo - VWF ristocetin cofactor activity; FVIII - factor VIII activity. MEDICAL DIRECTOR: For questions regarding panel interpretation, please contact Lebron ConnersBrian Poirier, M.D. at [facility] at [phone]. ------------------------------- DISCLAIMER These assessments and interpretations are provided as a convenience in support of the physician-patient relationship and are not intended to replace the physician's clinical judgment. They are derived from national guidelines in addition to other evidence and expert opinion. The clinician should consider this information within the context of clinical opinion and the individual patient. SEE GUIDANCE FOR VON WILLEBRAND FACTOR ASSESSMENT: (1) The National Heart, Lung and Blood Institute. The Diagnosis, Evaluation and Management of von Willebrand Disease. Lafe GarinBethesda, MD: [employer]                          h Publication 563 048 [id number]. 2007. Available at http://kemp.com/http://www.nhlbi.nih.gov/guidelines/vwd/. (2) Annie SableNichols [facility] et al. Othella BoyerAm J Hematol. 2009; 84(6):366-370. (3) Laffan M et al. Haemophilia. 2004;10(3):199-217. (4) Pasi KJ et al. Haemophilia. 2004; 10(3):218-231. Performed At: [facility] [address]  [place], [place] [id number] Blanchie ServeEnnis Jennifer MD HY:[phone]     RADIOGRAPHIC STUDIES: I have personally reviewed the radiological images as listed and agree with the findings in the report  No results found.  ASSESSMENT/PLAN  41 year old female with dysfunctional uterine bleeding who has ongoing requirement for oral iron replacement  Ongoing need for oral iron:  Most likely etiology is dysfunctional uterine bleeding.  Will obtain CBC with diff, CMP, Ferritin  Dysfunctional uterine bleeding:  This is characterized by menses that are prolonged, irregular as well as by heavy bleeding with passage of clots.  Will evaluate for possible bleeding disorder with PT, PTT, Fibrinogen, von Willebrand screen.  Would potentially benefit from referral to gynecology for evaluation and management   Cancer Staging  No matching staging information was found for the patient.   No problem-specific Assessment & Plan notes found for this encounter.   Orders Placed This Encounter  Procedures   CBC with Differential (Cancer Center Only)    Standing Status:   Future    Number of Occurrences:   1    Standing Expiration Date:   11/14/2022   Ferritin    Standing Status:   Future    Number of Occurrences:   1    Standing Expiration Date:   11/14/2022   APTT    Standing Status:   Future    Number of Occurrences:   1    Standing Expiration Date:   11/14/2022  Protime-INR    Standing Status:   Future    Number of Occurrences:   1    Standing Expiration Date:   11/14/2022   Fibrinogen    Standing Status:   Future    Number of Occurrences:   1    Standing Expiration Date:   11/14/2022   Von Willebrand panel    Standing Status:   Future    Number of Occurrences:   1    Standing Expiration Date:   11/14/2022    All questions were answered. The patient knows to call the clinic with any problems, questions or concerns.  This note was electronically signed.    Barbee Cough, MD  11/20/2021 10:23  AM

## 2021-11-15 LAB — VON WILLEBRAND PANEL
Coagulation Factor VIII: 109 % (ref 56–140)
Ristocetin Co-factor, Plasma: 86 % (ref 50–200)
Von Willebrand Antigen, Plasma: 98 % (ref 50–200)

## 2021-11-15 LAB — COAG STUDIES INTERP REPORT

## 2021-11-17 NOTE — Progress Notes (Unsigned)
[facility] Cancer Follow up Visit:  Patient Care Team: Eber Jones, NP as PCP - General (Family Medicine)  CHIEF COMPLAINTS/PURPOSE OF CONSULTATION:  Oncology History   No history exists.    HISTORY OF PRESENTING ILLNESS: Anna [name] 41 y.o. female is here because of possible iron deficiency Medical history notable for hypertension, B12 deficiency, malignant hyperthermia, vitamin D deficiency, morbid obesity  September 11, 2021: WBC 7.5 hemoglobin 12.3 platelet count 274 MCV 83; 68% segs 22% lymphs 7 monos 2 eos 1 basophil iron saturation 13% B12 317  November 13 2021:  [facility] Hematology Consult Patient states that she has no prior history of anemia.  Was placed on oral iron x 2 yrs.  No history of blood transfusion or receiving IV iron Has not undergone endoscopy.   Patient is G1 P1 with last pregnancy delivered 4 yrs ago by C section.  Did not require reoperation or transfusion following delivery. Menses occur monthly, last 5 to 7 days.  Bleeding varies from medium to heavy.  No bleeding between periods.  Has not been diagnosed with dysfunctional uterine bleeding.  Has been on OCP's but this was not to regulate menses. No history of intra-articular or intramuscular bleeding.  Does not take NSAIDS and is not on anticoagulants No cravings for ice Taking iron sulfate 325 mg daily which she is tolerating well.  Gynecologic care is via her NP.    Social:  Works at [employer].  Quit smoking cigarettes 6 yrs ago.  EtOH 2 drinks twice weekly  WBC 7.6 hemoglobin 13.4 platelet count 287; 67 segs 24 lymphs 7 monos 1 EO 1 basophil Fibrinogen 395 INR 1.2 PTT 35 ferritin 47 Von Willebrand factor antigen 98% ristocetin cofactor 86% factor VIII 109%  November 20, 2021:  Scheduled follow-up for management of anemia.  Reviewed results of labs with patient and her mother.  She continues to take oral iron and tolerates it well  Review of Systems - Oncology  MEDICAL HISTORY: Past  Medical History:  Diagnosis Date   Anxiety    B12 deficiency    Chlamydia    Depression    Hypertension    Malignant hyperthermia    PT STATES DX WITH MALIGNANT HYPERTHERMIA WHEN SHE HAD ADNOIDECTOMY AT AGE 57-DID WEAR ALERT BRACELET AS A CHILD-BUT NO LONGER WEARS.   Trichomoniasis 2018   Vitamin D deficiency     SURGICAL HISTORY: Past Surgical History:  Procedure Laterality Date   CESAREAN SECTION N/A 02/22/2017   Procedure: CESAREAN SECTION;  Surgeon: Waynard Reeds, MD;  Location: [facility] BIRTHING SUITES;  Service: Obstetrics;  Laterality: N/A;   CHOLECYSTECTOMY  05/16/2011   Procedure: LAPAROSCOPIC CHOLECYSTECTOMY WITH INTRAOPERATIVE CHOLANGIOGRAM;  Surgeon: Clovis Pu. Cornett, MD;  Location: [facility] ORS;  Service: General;  Laterality: N/A;  Laparoscopic Cholecystectomy and Cholangiogram    wisdom teeth extracted  01/22/1998   pt states surgery was done at [facility]-BECAUSE OF HER PAST HX MALIGNANT HYPERTHERMIA-SURGERY BY DR. Warren Danes    SOCIAL HISTORY: Social History   Socioeconomic History   Marital status: Single    Spouse name: Not on file   Number of children: 1   Years of education: 14   Highest education level: Associate degree: academic program  Occupational History   Occupation: LPN  Tobacco Use   Smoking status: Former    Types: Cigars    Quit date: 07/16/2016    Years since quitting: 5.3   Smokeless tobacco: Never   Tobacco comments:  7 CIGARS IN A PACK -SMOKES ONE PACK DAILY  Vaping Use   Vaping Use: Never used  Substance and Sexual Activity   Alcohol use: Yes    Comment: RARELY   Drug use: No   Sexual activity: Yes    Birth control/protection: None  Other Topics Concern   Not on file  Social History Narrative   Not on file   Social Determinants of Health   Financial Resource Strain: Not on file  Food Insecurity: Not on file  Transportation Needs: Not on file  Physical Activity: Not on file  Stress: Not on file  Social Connections: Not on file   Intimate Partner Violence: Not on file    FAMILY HISTORY Family History  Problem Relation Age of Onset   Hypertension Mother    Breast cancer Mother    Heart disease Mother    Hypertension Father    Diabetes Father    Gout Father    Diabetes Maternal Grandmother    Diabetes Maternal Grandfather     ALLERGIES:  is allergic to anectine [succinylcholine chloride].  MEDICATIONS:  Current Outpatient Medications  Medication Sig Dispense Refill   amLODipine (NORVASC) 10 MG tablet Take 10 mg by mouth daily.     FLUoxetine (PROZAC) 40 MG capsule Take 1 capsule (40 mg total) by mouth daily. 90 capsule 0   losartan (COZAAR) 100 MG tablet Take 100 mg by mouth daily.     No current facility-administered medications for this visit.    PHYSICAL EXAMINATION:  ECOG PERFORMANCE STATUS: 1 - Symptomatic but completely ambulatory   There were no vitals filed for this visit.  There were no vitals filed for this visit.   Physical Exam Vitals and nursing note reviewed.  Constitutional:      Appearance: Normal appearance. She is not toxic-appearing or diaphoretic.     Comments: Here with mother.    HENT:     Head: Normocephalic and atraumatic.     Right Ear: External ear normal.     Left Ear: External ear normal.     Nose: Nose normal. No congestion or rhinorrhea.  Eyes:     General: No scleral icterus.    Extraocular Movements: Extraocular movements intact.     Conjunctiva/sclera: Conjunctivae normal.     Pupils: Pupils are equal, round, and reactive to light.  Cardiovascular:     Rate and Rhythm: Normal rate.     Heart sounds: No murmur heard.    No friction rub. No gallop.  Abdominal:     General: Bowel sounds are normal.     Palpations: Abdomen is soft.  Musculoskeletal:        General: No swelling, tenderness or deformity.     Cervical back: Normal range of motion and neck supple. No rigidity or tenderness.  Lymphadenopathy:     Head:     Right side of head: No  submental, submandibular, tonsillar, preauricular, posterior auricular or occipital adenopathy.     Left side of head: No submental, submandibular, tonsillar, preauricular, posterior auricular or occipital adenopathy.     Cervical: No cervical adenopathy.     Right cervical: No superficial, deep or posterior cervical adenopathy.    Left cervical: No superficial, deep or posterior cervical adenopathy.     Upper Body:     Right upper body: No supraclavicular, axillary, pectoral or epitrochlear adenopathy.     Left upper body: No supraclavicular, axillary, pectoral or epitrochlear adenopathy.  Skin:    General: Skin is warm.  Coloration: Skin is not jaundiced.  Neurological:     General: No focal deficit present.     Mental Status: She is alert and oriented to person, place, and time. Mental status is at baseline.     Cranial Nerves: No cranial nerve deficit.  Psychiatric:        Mood and Affect: Mood normal.        Behavior: Behavior normal.        Thought Content: Thought content normal.        Judgment: Judgment normal.     LABORATORY DATA: I have personally reviewed the data as listed:  Clinical Support on 11/13/2021  Component Date Value Ref Range Status   Coagulation Factor VIII 11/13/2021 109  56 - 140 % Final   Ristocetin Co-factor, Plasma 11/13/2021 86  50 - 200 % Final   Comment: (NOTE) Performed At: [facility] [address] [place], [place] [id number] Jolene SchimkeNagendra Sanjai MD WU:[phone]    Von Willebrand Antigen, Plasma 11/13/2021 98  50 - 200 % Final   Comment: (NOTE) This test was developed and its performance characteristics determined by [facility]. It has not been cleared or approved by the Food and Drug Administration.    Fibrinogen 11/13/2021 395  210 - 475 mg/dL Final   Comment: (NOTE) Fibrinogen results may be underestimated in patients receiving thrombolytic therapy. Performed at [facility], [address] [place] [address]    Prothrombin Time 11/13/2021 15.1  11.4 - 15.2 seconds Final   INR 11/13/2021 1.2  0.8 - 1.2 Final   Comment: (NOTE) INR goal varies based on device and disease states. Performed at [facility], [address] [place], [place] [address]    aPTT 11/13/2021 35  24 - 36 seconds Final   Performed at [facility], [address] [place], [place] [address]   Ferritin 11/13/2021 47  11 - 307 ng/mL Final   Performed at [facility], [address] [place], [place] [address]   WBC Count 11/13/2021 7.6  4.0 - 10.5 K/uL Final   RBC 11/13/2021 5.04  3.87 - 5.11 MIL/uL Final   Hemoglobin 11/13/2021 13.4  12.0 - 15.0 g/dL Final   HCT 84/13/244010/23/2023 42.1  36.0 - 46.0 % Final   MCV 11/13/2021 83.5  80.0 - 100.0 fL Final   MCH 11/13/2021 26.6  26.0 - 34.0 pg Final   MCHC 11/13/2021 31.8  30.0 - 36.0 g/dL Final   RDW 10/27/253610/23/2023 15.1  11.5 - 15.5 % Final   Platelet Count 11/13/2021 287  150 - 400 K/uL Final   nRBC 11/13/2021 0.0  0.0 - 0.2 % Final   Neutrophils Relative % 11/13/2021 67  % Final   Neutro Abs 11/13/2021 5.1  1.7 - 7.7 K/uL Final   Lymphocytes Relative 11/13/2021 24  % Final   Lymphs Abs 11/13/2021 1.8  0.7 - 4.0 K/uL Final   Monocytes Relative 11/13/2021 7  % Final   Monocytes Absolute 11/13/2021 0.5  0.1 - 1.0 K/uL Final   Eosinophils Relative 11/13/2021 1  % Final   Eosinophils Absolute 11/13/2021 0.1  0.0 - 0.5 K/uL Final   Basophils Relative 11/13/2021 1  % Final   Basophils Absolute 11/13/2021 0.0  0.0 - 0.1 K/uL Final   Immature Granulocytes 11/13/2021 0  % Final   Abs Immature Granulocytes 11/13/2021 0.01  0.00 - 0.07 K/uL Final   Performed at [facility], [address] [place], [place] [address]   Interpretation 11/13/2021 Note   Final   Comment: (NOTE) ------------------------------- COAGULATION: VON WILLEBRAND FACTOR ASSESSMENT CURRENT RESULTS ASSESSMENT The VWF:Ag is normal. The  VWF:RCo is normal. The FVIII is normal. VON WILLEBRAND FACTOR ASSESSMENT CURRENT RESULTS INTERPRETATION - These results are not consistent with a diagnosis of VWD according to the current NHLBI guideline. VON WILLEBRAND FACTOR ASSESSMENT - Results may be falsely elevated and possibly falsely normal as VWF and FVIII may increase in pregnancy, in samples drawn from patients (particularly children) who are visibly stressed at the time of phlebotomy, as acute phase reactants, or in response to certain drug therapies such as desmopressin. Repeat testing may be necessary before excluding a diagnosis of VWD especially if the clinical suspicion is high for an underlying bleeding disorder. The setting for phlebotomy should be as calm as possible and patients should be encouraged to sit quietly prior to the blood draw. VON WILLEBRAND FA                          CTOR ASSESSMENT DEFINITIONS - VWD - von Willebrand disease; VWF - von Willebrand factor; VWF:Ag - VWF antigen; VWF:RCo - VWF ristocetin cofactor activity; FVIII - factor VIII activity. MEDICAL DIRECTOR: For questions regarding panel interpretation, please contact Jake Bathe, M.D. at [facility] at [phone]. ------------------------------- DISCLAIMER These assessments and interpretations are provided as a convenience in support of the physician-patient relationship and are not intended to replace the physician's clinical judgment. They are derived from national guidelines in addition to other evidence and expert opinion. The clinician should consider this information within the context of clinical opinion and the individual patient. SEE GUIDANCE FOR VON WILLEBRAND FACTOR ASSESSMENT: (1) The National Heart, Lung and Blood Institute. The Diagnosis, Evaluation and Management of von Willebrand Disease. Janeal Holmes, MD: [place]                          h Publication 66-4403. 4742.  Available at vSpecials.com.pt. (2) Daryl Eastern et al. Carmin [place] J Hematol. 2009; 84(6):366-370. (3) [place]. 2004;10(3):199-217. (4) Pasi KJ et al. Haemophilia. 2004; 10(3):218-231. Performed At: [facility] [address] [place], [place] [id number] Eino Farber MD [id number]     RADIOGRAPHIC STUDIES: I have personally reviewed the radiological images as listed and agree with the findings in the report  No results found.  ASSESSMENT/PLAN  41 year old female with dysfunctional uterine bleeding who has ongoing requirement for oral iron replacement   Ongoing need for oral iron:  Most likely etiology is dysfunctional uterine bleeding.  She has adequate iron stores and is not currently anemic but this is due to regular replacement with oral iron.     Dysfunctional uterine bleeding:  This is characterized by menses that are prolonged, irregular as well as by heavy bleeding with passage of clots.  No evidence of an underlying bleeding disorder on screening labs (PT, PTT, Fibrinogen, von Willebrand screen.)   Management:  Would potentially benefit from referral to gynecology for evaluation and management.  Given her age will make GI referral to exonerate GIT as source of occult blood loss.  Follow up:  PRN   Cancer Staging  No matching staging information was found for the patient.   No problem-specific Assessment & Plan notes found for this encounter.   No orders of the defined types were placed in this encounter.  All questions were answered. The patient knows to call the clinic with any problems, questions or concerns.  This note was electronically signed.    Loni Muse, MD  11/17/2021 2:22 PM

## 2021-11-20 ENCOUNTER — Inpatient Hospital Stay: Payer: Medicaid Other | Admitting: Oncology

## 2021-11-20 VITALS — BP 169/92 | HR 64 | Temp 97.7°F | Resp 18 | Ht 66.5 in | Wt 338.6 lb

## 2021-11-20 DIAGNOSIS — N938 Other specified abnormal uterine and vaginal bleeding: Secondary | ICD-10-CM

## 2021-11-20 DIAGNOSIS — D500 Iron deficiency anemia secondary to blood loss (chronic): Secondary | ICD-10-CM | POA: Diagnosis not present

## 2021-11-21 ENCOUNTER — Ambulatory Visit: Payer: Medicaid Other | Admitting: Oncology

## 2021-11-25 ENCOUNTER — Ambulatory Visit
Admission: EM | Admit: 2021-11-25 | Discharge: 2021-11-25 | Disposition: A | Discharge status: Home / Self Care | Payer: Medicaid Other | Attending: Internal Medicine | Admitting: Internal Medicine

## 2021-11-25 DIAGNOSIS — R051 Acute cough: Secondary | ICD-10-CM | POA: Diagnosis not present

## 2021-11-25 DIAGNOSIS — B349 Viral infection, unspecified: Secondary | ICD-10-CM | POA: Diagnosis not present

## 2021-11-25 DIAGNOSIS — J029 Acute pharyngitis, unspecified: Secondary | ICD-10-CM | POA: Diagnosis not present

## 2021-11-25 MED ORDER — CHLORASEPTIC 1.4 % MT LIQD: 1.0000 | OROMUCOSAL | 0 refills | Status: DC | PRN

## 2021-11-25 MED ORDER — BENZONATATE 100 MG PO CAPS: 100.0000 mg | ORAL_CAPSULE | Freq: Three times a day (TID) | ORAL | 0 refills | Status: DC | PRN

## 2021-11-25 NOTE — Discharge Instructions (Addendum)
You have a viral illness that should run its course and self resolve with symptomatic treatment as we discussed.  Please follow-up if any symptoms persist or worsen.  I have sent you medications which should be safe with the daily medications that you take.

## 2021-11-25 NOTE — ED Triage Notes (Signed)
Pt c/o sore throat, cough,   Onset ~ yesterday   Denies pain in triage

## 2021-11-25 NOTE — ED Provider Notes (Signed)
[facility]    CSN: [id number] Arrival date & time: 11/25/21  3559      History   Chief Complaint Chief Complaint  Patient presents with   Cough    HPI Anna [name] is a 41 y.o. female.   Patient presents with cough and sore throat that started yesterday.  Denies nasal congestion, runny nose, fever, chest pain, shortness of breath, nausea, vomiting, diarrhea, abdominal pain.  Her child who is present in exam room has also had similar symptoms.  She has not taken any medications to alleviate symptoms.  She does report history of asthma but has not been having to use her inhaler more often while being sick.  Patient reports that she took a COVID test at home that was negative.   Cough   Past Medical History:  Diagnosis Date   Anxiety    B12 deficiency    Chlamydia    Depression    Hypertension    Malignant hyperthermia    PT STATES DX WITH MALIGNANT HYPERTHERMIA WHEN SHE HAD ADNOIDECTOMY AT AGE 5-DID WEAR ALERT BRACELET AS A CHILD-BUT NO LONGER WEARS.   Trichomoniasis 2018   Vitamin D deficiency     Patient Active Problem List   Diagnosis Date Noted   Iron deficiency anemia due to chronic blood loss 11/13/2021   Dysfunctional uterine bleeding 11/13/2021   Cesarean delivery, delivered, current hospitalization 02/22/2017   Chronic hypertension during pregnancy, antepartum 02/19/2017   Indication for care in labor and delivery, antepartum 02/19/2017    Past Surgical History:  Procedure Laterality Date   CESAREAN SECTION N/A 02/22/2017   Procedure: CESAREAN SECTION;  Surgeon: Waynard Reeds, MD;  Location: [facility] BIRTHING SUIT[facility];  Service: Obstetrics;  Laterality: N/A;   CHOLECYSTECTOMY  05/16/2011   Procedure: LAPAROSCOPIC CHOLECYSTECTOMY WITH INTRAOPERATIVE CHOLANGIOGRAM;  Surgeon: Clovis Pu. Cornett, MD;  Location: [facility];  Service: General;  Laterality: N/A;  Laparoscopic Cholecystectomy and Cholangiogram    wisdom teeth extracted  01/22/1998   pt states  surgery was done at [facility]-BECAUSE OF HER PAST HX MALIGNANT HYPERTHERMIA-SURGERY BY DR. Warren Danes    OB History     Gravida  3   Para  1   Term      Preterm  1   AB  2   Living  1      SAB  1   IAB  1   Ectopic      Multiple  0   Live Births  1            Home Medications    Prior to Admission medications   Medication Sig Start Date End Date Taking? Authorizing Provider  benzonatate (TESSALON) 100 MG capsule Take 1 capsule (100 mg total) by mouth every 8 (eight) hours as needed for cough. 11/25/21  Yes Keria Widrig, Rolly Salter E, FNP  phenol (CHLORASEPTIC) 1.4 % LIQD Use as directed 1 spray in the mouth or throat as needed for throat irritation / pain. 11/25/21  Yes Deshanda Molitor, Rolly Salter E, FNP  amLODipine (NORVASC) 10 MG tablet Take 10 mg by mouth daily. 08/18/21   [provider]  FLUoxetine (PROZAC) 40 MG capsule Take 1 capsule (40 mg total) by mouth daily. 06/06/20   Thresa Ross, MD  losartan (COZAAR) 100 MG tablet Take 100 mg by mouth daily. 08/18/21   [provider]    Family History Family History  Problem Relation Age of Onset   Hypertension Mother    Breast cancer Mother  Heart disease Mother    Hypertension Father    Diabetes Father    Gout Father    Diabetes Maternal Grandmother    Diabetes Maternal Grandfather     Social History Social History   Tobacco Use   Smoking status: Former    Types: Cigars    Quit date: 07/16/2016    Years since quitting: 5.3   Smokeless tobacco: Never   Tobacco comments:    7 CIGARS IN A PACK -SMOKES ONE PACK DAILY  Vaping Use   Vaping Use: Never used  Substance Use Topics   Alcohol use: Yes    Comment: RARELY   Drug use: No     Allergies   Anectine [succinylcholine chloride]   Review of Systems Review of Systems Per HPI  Physical Exam Triage Vital Signs ED Triage Vitals  Enc Vitals Group     BP 11/25/21 0912 (!) 145/85     Pulse Rate 11/25/21 0912 70     Resp 11/25/21 0912 18     Temp  11/25/21 0912 97.7 F (36.5 C)     Temp Source 11/25/21 0912 Oral     SpO2 11/25/21 0912 96 %     Weight --      Height --      Head Circumference --      Peak Flow --      Pain Score 11/25/21 0910 0     Pain Loc --      Pain Edu? --      Excl. in [place]? --    No data found.  Updated Vital Signs BP (!) 145/85 (BP Location: Right Arm)   Pulse 70   Temp 97.7 F (36.5 C) (Oral)   Resp 18   SpO2 96%   Breastfeeding No   Visual Acuity Right Eye Distance:   Left Eye Distance:   Bilateral Distance:    Right Eye Near:   Left Eye Near:    Bilateral Near:     Physical Exam Constitutional:      General: She is not in acute distress.    Appearance: Normal appearance. She is not toxic-appearing or diaphoretic.  HENT:     Head: Normocephalic and atraumatic.     Right Ear: Tympanic membrane and ear canal normal.     Left Ear: Tympanic membrane and ear canal normal.     Nose: Congestion present.     Mouth/Throat:     Mouth: Mucous membranes are moist.     Pharynx: Posterior oropharyngeal erythema present. No pharyngeal swelling, oropharyngeal exudate or uvula swelling.     Tonsils: No tonsillar exudate or tonsillar abscesses.  Eyes:     Extraocular Movements: Extraocular movements intact.     Conjunctiva/sclera: Conjunctivae normal.     Pupils: Pupils are equal, round, and reactive to light.  Cardiovascular:     Rate and Rhythm: Normal rate and regular rhythm.     Pulses: Normal pulses.     Heart sounds: Normal heart sounds.  Pulmonary:     Effort: Pulmonary effort is normal. No respiratory distress.     Breath sounds: Normal breath sounds. No stridor. No wheezing, rhonchi or rales.  Abdominal:     General: Abdomen is flat. Bowel sounds are normal.     Palpations: Abdomen is soft.  Musculoskeletal:        General: Normal range of motion.     Cervical back: Normal range of motion.  Skin:    General: Skin is warm and  dry.  Neurological:     General: No focal deficit  present.     Mental Status: She is alert and oriented to person, place, and time. Mental status is at baseline.  Psychiatric:        Mood and Affect: Mood normal.        Behavior: Behavior normal.      UC Treatments / Results  Labs (all labs ordered are listed, but only abnormal results are displayed) Labs Reviewed - No data to display  EKG   Radiology No results found.  Procedures Procedures (including critical care time)  Medications Ordered in UC Medications - No data to display  Initial Impression / Assessment and Plan / UC Course  I have reviewed the triage vital signs and the nursing notes.  Pertinent labs & imaging results that were available during my care of the patient were reviewed by me and considered in my medical decision making (see chart for details).     Patient presents with symptoms likely from a viral upper respiratory infection. Differential includes bacterial pneumonia, sinusitis, allergic rhinitis, COVID-19, flu, RSV. Do not suspect underlying cardiopulmonary process. Symptoms seem unlikely related to ACS, CHF or COPD exacerbations, pneumonia, pneumothorax. Patient is nontoxic appearing and not in need of emergent medical intervention.  Patient declined COVID testing given that she had negative COVID test at home.  Do not think strep testing is necessary given appearance of posterior pharynx on exam.  Recommended symptom control with medications and supportive care.  Patient sent prescriptions to alleviate symptoms.  Return if symptoms fail to improve in 1-2 weeks or you develop shortness of breath, chest pain, severe headache. Patient states understanding and is agreeable.  Discharged with PCP followup.  Final Clinical Impressions(s) / UC Diagnoses   Final diagnoses:  Viral illness  Acute cough  Sore throat     Discharge Instructions      You have a viral illness that should run its course and self resolve with symptomatic treatment as we  discussed.  Please follow-up if any symptoms persist or worsen.  I have sent you medications which should be safe with the daily medications that you take.    ED Prescriptions     Medication Sig Dispense Auth. Provider   benzonatate (TESSALON) 100 MG capsule Take 1 capsule (100 mg total) by mouth every 8 (eight) hours as needed for cough. 21 capsule [place], [place], [place]   phenol (CHLORASEPTIC) 1.4 % LIQD Use as directed 1 spray in the mouth or throat as needed for throat irritation / pain. 118 mL Gustavus Bryant, [place]      PDMP not reviewed this encounter.   Gustavus Bryant, [place] 11/25/21 1032

## 2022-07-12 DIAGNOSIS — R300 Dysuria: Secondary | ICD-10-CM | POA: Insufficient documentation

## 2022-07-12 DIAGNOSIS — N898 Other specified noninflammatory disorders of vagina: Secondary | ICD-10-CM | POA: Insufficient documentation

## 2022-07-12 DIAGNOSIS — L292 Pruritus vulvae: Secondary | ICD-10-CM | POA: Insufficient documentation

## 2022-07-14 DIAGNOSIS — A599 Trichomoniasis, unspecified: Secondary | ICD-10-CM | POA: Insufficient documentation

## 2022-12-02 ENCOUNTER — Telehealth: Payer: Medicaid Other

## 2022-12-02 ENCOUNTER — Ambulatory Visit
Admission: EM | Admit: 2022-12-02 | Discharge: 2022-12-02 | Disposition: A | Discharge status: Home / Self Care | Payer: Medicaid Other | Attending: Internal Medicine | Admitting: Internal Medicine

## 2022-12-02 DIAGNOSIS — F909 Attention-deficit hyperactivity disorder, unspecified type: Secondary | ICD-10-CM | POA: Insufficient documentation

## 2022-12-02 DIAGNOSIS — O409XX0 Polyhydramnios, unspecified trimester, not applicable or unspecified: Secondary | ICD-10-CM | POA: Insufficient documentation

## 2022-12-02 DIAGNOSIS — O09529 Supervision of elderly multigravida, unspecified trimester: Secondary | ICD-10-CM | POA: Insufficient documentation

## 2022-12-02 DIAGNOSIS — H1013 Acute atopic conjunctivitis, bilateral: Secondary | ICD-10-CM | POA: Diagnosis not present

## 2022-12-02 DIAGNOSIS — Z202 Contact with and (suspected) exposure to infections with a predominantly sexual mode of transmission: Secondary | ICD-10-CM | POA: Diagnosis present

## 2022-12-02 DIAGNOSIS — F32A Depression, unspecified: Secondary | ICD-10-CM | POA: Insufficient documentation

## 2022-12-02 DIAGNOSIS — Q270 Congenital absence and hypoplasia of umbilical artery: Secondary | ICD-10-CM | POA: Insufficient documentation

## 2022-12-02 DIAGNOSIS — I10 Essential (primary) hypertension: Secondary | ICD-10-CM | POA: Insufficient documentation

## 2022-12-02 DIAGNOSIS — I87309 Chronic venous hypertension (idiopathic) without complications of unspecified lower extremity: Secondary | ICD-10-CM | POA: Insufficient documentation

## 2022-12-02 DIAGNOSIS — F419 Anxiety disorder, unspecified: Secondary | ICD-10-CM | POA: Insufficient documentation

## 2022-12-02 DIAGNOSIS — O3660X0 Maternal care for excessive fetal growth, unspecified trimester, not applicable or unspecified: Secondary | ICD-10-CM | POA: Insufficient documentation

## 2022-12-02 MED ORDER — DOXYCYCLINE HYCLATE 100 MG PO CAPS: 100.0000 mg | ORAL_CAPSULE | Freq: Two times a day (BID) | ORAL | 0 refills | Status: AC

## 2022-12-02 MED ORDER — OLOPATADINE HCL 0.2 % OP SOLN: 1.0000 [drp] | Freq: Two times a day (BID) | OPHTHALMIC | 0 refills | Status: DC

## 2022-12-02 NOTE — Discharge Instructions (Addendum)
Please abstain from sexual intercourse for 7 days to allow the medications to work Please take the medications with a glass of water Will call you with recommendations if labs are abnormal If you have worsening symptoms please return to urgent care for further evaluation

## 2022-12-02 NOTE — Progress Notes (Signed)
Virtual Visit Consent   Anna [name], you are scheduled for a virtual visit with a [facility] provider today. Just as with appointments in the office, your consent must be obtained to participate. Your consent will be active for this visit and any virtual visit you may have with one of our providers in the next 365 days. If you have a MyChart account, a copy of this consent can be sent to you electronically.  As this is a virtual visit, video technology does not allow for your provider to perform a traditional examination. This may limit your provider's ability to fully assess your condition. If your provider identifies any concerns that need to be evaluated in person or the need to arrange testing (such as labs, EKG, etc.), we will make arrangements to do so. Although advances in technology are sophisticated, we cannot ensure that it will always work on either your end or our end. If the connection with a video visit is poor, the visit may have to be switched to a telephone visit. With either a video or telephone visit, we are not always able to ensure that we have a secure connection.  By engaging in this virtual visit, you consent to the provision of healthcare and authorize for your insurance to be billed (if applicable) for the services provided during this visit. Depending on your insurance coverage, you may receive a charge related to this service.  I need to obtain your verbal consent now. Are you willing to proceed with your visit today? Anna [name] has provided verbal consent on 12/02/2022 for a virtual visit (video or telephone). Claiborne Rigg, NP  Date: 12/02/2022 8:23 AM  Virtual Visit via Video Note   I, Claiborne Rigg, connected with  Anna [name]  ([id number], [birth year]) on 12/02/22 at  8:30 AM EST by a video-enabled telemedicine application and verified that I am speaking with the correct person using two identifiers.  Location: Patient: Virtual Visit Location Patient:  Mobile Provider: Virtual Visit Location Provider: Home Office   I discussed the limitations of evaluation and management by telemedicine and the availability of in person appointments. The patient expressed understanding and agreed to proceed.    History of Present Illness: Anna [name] is a 42 y.o. who identifies as a female who was assigned female at birth, and is being seen today for possible STD exposure and bilateral allergic conjunctivitis.  Ms. Labra states her partner was recently diagnosed with chlamydia and she would like to be treated. She denies any GU symptoms. She does state however she has been experiencing bilateral eye redness.   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   Trichomonas infection 07/14/2022   Dysuria 07/12/2022   Vaginal irritation 07/12/2022   Vulvar itching 07/12/2022   Iron deficiency anemia due to chronic blood loss 11/13/2021   Dysfunctional uterine bleeding 11/13/2021   Disease due to 2019 novel coronavirus 02/16/2020   Pharyngitis, streptococcal 02/16/2020   Cesarean delivery, delivered, current hospitalization 02/22/2017   Chronic hypertension during pregnancy, antepartum 02/19/2017   Indication for care in labor and delivery, antepartum 02/19/2017   Pregnancy 08/09/2016    Allergies:  Allergies  Allergen Reactions   Succinylcholine Chloride     Other Reaction(s): Not available   Medications:  Current Outpatient Medications:    Olopatadine HCl 0.2 % SOLN, Apply 1 drop to eye 2 (two) times daily., Disp: 2.5 mL, Rfl: 0   amLODipine (NORVASC) 10 MG tablet, Take 10 mg by mouth  daily., Disp: , Rfl:    amoxicillin-clavulanate (AUGMENTIN) 875-125 MG tablet, Take 1 tablet by mouth 2 (two) times daily., Disp: , Rfl:    ARNUITY ELLIPTA 100 MCG/ACT AEPB, Inhale 1 puff into the lungs daily., Disp: , Rfl:    augmented betamethasone dipropionate (DIPROLENE-AF) 0.05 % ointment, Apply 1 Application topically 2 (two) times daily., Disp: , Rfl:    benzonatate (TESSALON) 100 MG capsule, Take 1 capsule (100 mg total) by mouth every 8 (eight) hours as needed for cough., Disp: 21 capsule, Rfl: 0   benzonatate (TESSALON) 200 MG capsule, Take 400 mg by mouth 2 (two) times daily., Disp: , Rfl:    dextromethorphan (DELSYM) 30 MG/5ML liquid, Take 10 mLs by mouth 2 (two) times daily., Disp: , Rfl:    DULERA 200-5 MCG/ACT AERO, Inhale 2 puffs into the lungs 2 (two) times daily., Disp: , Rfl:    FLUoxetine (PROZAC) 20 MG capsule, Take 20 mg by mouth daily., Disp: , Rfl:    FLUoxetine (PROZAC) 40 MG capsule, Take 1 capsule (40 mg total) by mouth daily., Disp: 90 capsule, Rfl: 0   fluticasone (FLONASE) 50 MCG/ACT nasal spray, Place 1 spray into both nostrils 2 (two) times daily., Disp: , Rfl:    labetalol (NORMODYNE) 100 MG tablet, Take 100 mg by mouth 2 (two) times daily., Disp: , Rfl:    lamoTRIgine (LAMICTAL) 25 MG tablet, Take 25 mg by mouth as directed., Disp: , Rfl:    losartan (COZAAR) 100 MG tablet, Take 100 mg by mouth daily., Disp: , Rfl:    metroNIDAZOLE (FLAGYL) 500 MG tablet, Take 500 mg by mouth 2 (two) times daily., Disp: , Rfl:    norethindrone (MICRONOR) 0.35 MG tablet, Take 1 tablet by mouth daily., Disp: , Rfl:    omeprazole (PRILOSEC) 40 MG capsule, Take 40 mg by mouth at bedtime., Disp: , Rfl:    predniSONE (DELTASONE) 10 MG tablet, Take 10 mg by mouth daily with breakfast., Disp: , Rfl:    promethazine-dextromethorphan (PROMETHAZINE-DM) 6.25-15 MG/5ML syrup, Take 5 mLs by mouth 3 (three) times daily as needed., Disp: , Rfl:   Observations/Objective: Patient is  well-developed, well-nourished in no acute distress.  Resting comfortably at home.  Head is normocephalic, atraumatic.  No labored breathing.  Speech is clear and coherent with logical content.  Patient is alert and oriented at baseline.    Assessment and Plan: 1. Allergic conjunctivitis of both eyes - Olopatadine HCl 0.2 % SOLN; Apply 1 drop to eye 2 (two) times daily.  Dispense: 2.5 mL; Refill: 0  Follow up at urgent care for exposure to chlamydia for evaluation and treatment.  Follow Up Instructions: I discussed the assessment and treatment plan with the patient. The patient was provided an opportunity to ask questions and all were answered. The patient agreed with the plan and demonstrated an understanding of the instructions.  A copy of instructions were sent to the patient via MyChart unless otherwise noted below.    The patient was advised to call back or seek an in-person evaluation if the symptoms worsen or if the condition fails to improve as anticipated.    Claiborne Rigg,  NP

## 2022-12-02 NOTE — Patient Instructions (Signed)
Dorthula Rue, thank you for joining Claiborne Rigg, NP for today's virtual visit.  While this provider is not your primary care provider (PCP), if your PCP is located in our provider database this encounter information will be shared with them immediately following your visit.   A [facility] MyChart account gives you access to today's visit and all your visits, tests, and labs performed at [facility] " click here if you don't have a [facility] MyChart account or go to [email or web address]  Consent: (Patient) TWALA OHLIN provided verbal consent for this virtual visit at the beginning of the encounter.  Current Medications:  Current Outpatient Medications:    Olopatadine HCl 0.2 % SOLN, Apply 1 drop to eye 2 (two) times daily., Disp: 2.5 mL, Rfl: 0   amLODipine (NORVASC) 10 MG tablet, Take 10 mg by mouth daily., Disp: , Rfl:    amoxicillin-clavulanate (AUGMENTIN) 875-125 MG tablet, Take 1 tablet by mouth 2 (two) times daily., Disp: , Rfl:    ARNUITY ELLIPTA 100 MCG/ACT AEPB, Inhale 1 puff into the lungs daily., Disp: , Rfl:    augmented betamethasone dipropionate (DIPROLENE-AF) 0.05 % ointment, Apply 1 Application topically 2 (two) times daily., Disp: , Rfl:    benzonatate (TESSALON) 100 MG capsule, Take 1 capsule (100 mg total) by mouth every 8 (eight) hours as needed for cough., Disp: 21 capsule, Rfl: 0   benzonatate (TESSALON) 200 MG capsule, Take 400 mg by mouth 2 (two) times daily., Disp: , Rfl:    dextromethorphan (DELSYM) 30 MG/5ML liquid, Take 10 mLs by mouth 2 (two) times daily., Disp: , Rfl:    DULERA 200-5 MCG/ACT AERO, Inhale 2 puffs into the lungs 2 (two) times daily., Disp: , Rfl:    FLUoxetine (PROZAC) 20 MG capsule, Take 20 mg by mouth daily., Disp: , Rfl:    FLUoxetine (PROZAC) 40 MG capsule, Take 1 capsule (40 mg total) by mouth daily., Disp: 90 capsule, Rfl: 0   fluticasone (FLONASE) 50 MCG/ACT nasal spray, Place 1 spray into both nostrils 2 (two)  times daily., Disp: , Rfl:    labetalol (NORMODYNE) 100 MG tablet, Take 100 mg by mouth 2 (two) times daily., Disp: , Rfl:    lamoTRIgine (LAMICTAL) 25 MG tablet, Take 25 mg by mouth as directed., Disp: , Rfl:    losartan (COZAAR) 100 MG tablet, Take 100 mg by mouth daily., Disp: , Rfl:    metroNIDAZOLE (FLAGYL) 500 MG tablet, Take 500 mg by mouth 2 (two) times daily., Disp: , Rfl:    norethindrone (MICRONOR) 0.35 MG tablet, Take 1 tablet by mouth daily., Disp: , Rfl:    omeprazole (PRILOSEC) 40 MG capsule, Take 40 mg by mouth at bedtime., Disp: , Rfl:    predniSONE (DELTASONE) 10 MG tablet, Take 10 mg by mouth daily with breakfast., Disp: , Rfl:    promethazine-dextromethorphan (PROMETHAZINE-DM) 6.25-15 MG/5ML syrup, Take 5 mLs by mouth 3 (three) times daily as needed., Disp: , Rfl:    Medications ordered in this encounter:  Meds ordered this encounter  Medications   Olopatadine HCl 0.2 % SOLN    Sig: Apply 1 drop to eye 2 (two) times daily.    Dispense:  2.5 mL    Refill:  0    Order Specific Question:   Supervising Provider    Answer:   Merrilee Jansky [[id number]]     *If you need refills on other medications prior to your next appointment, please contact your pharmacy*  Follow-Up:  Call back or seek an in-person evaluation if the symptoms worsen or if the condition fails to improve as anticipated.  [facility] [phone]  Other Instructions Follow up at urgent care for evaluation and treatment of chlamydia   If you have been instructed to have an in-person evaluation today at a local Urgent Care facility, please use the link below. It will take you to a list of all of our available [facility], including address, phone number and hours of operation. Please do not delay care.  [facility]  If you or a family member do not have a primary care provider, use the link below to schedule a visit and establish care. When you choose a [facility] primary care physician or advanced practice provider, you gain a long-term partner in health. Find a Primary Care Provider  Learn more about [facility]'s in-office and virtual care options: [place] - Get Care Now

## 2022-12-02 NOTE — ED Provider Notes (Signed)
[facility]    CSN: [id number] Arrival date & time: 12/02/22  0816      History   Chief Complaint Chief Complaint  Patient presents with   SEXUALLY TRANSMITTED DISEASE    Testing    HPI Anna [name] is a 42 y.o. female comes to the urgent care for STI evaluation.  Patient's sexual partner was diagnosed with chlamydia yesterday.  Patient has no symptoms.  She has noticed some redness of both eyes.  There is no eye discharge.  No sore throat.   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/02/2022   Trichomonas infection 07/14/2022   Dysuria 07/12/2022   Vaginal irritation 07/12/2022   Vulvar itching 07/12/2022   Iron deficiency anemia due to chronic blood loss 11/13/2021   Dysfunctional uterine bleeding 11/13/2021   Disease due to 2019 novel coronavirus 02/16/2020   Pharyngitis, streptococcal 02/16/2020   Cesarean delivery, delivered, current hospitalization 02/22/2017   Chronic hypertension during pregnancy, antepartum 02/19/2017   Indication for care in labor and delivery, antepartum 02/19/2017   Pregnancy 08/09/2016    Past Surgical History:  Procedure Laterality Date   CESAREAN SECTION N/A 02/22/2017   Procedure: CESAREAN SECTION;  Surgeon: Waynard Reeds, MD;  Location: [facility];   Service: Obstetrics;  Laterality: N/A;   CHOLECYSTECTOMY  05/16/2011   Procedure: LAPAROSCOPIC CHOLECYSTECTOMY WITH INTRAOPERATIVE CHOLANGIOGRAM;  Surgeon: Clovis Pu. Cornett, MD;  Location: [facility] ORS;  Service: General;  Laterality: N/A;  Laparoscopic Cholecystectomy and Cholangiogram    wisdom teeth extracted  01/22/1998   pt states surgery was done at [facility]-BECAUSE OF HER PAST HX MALIGNANT HYPERTHERMIA-SURGERY BY DR. Warren Danes    OB History     Gravida  3   Para  1   Term      Preterm  1   AB  2   Living  1      SAB  1   IAB  1   Ectopic      Multiple  0   Live Births  1            Home Medications    Prior to Admission medications   Medication Sig Start Date End Date Taking? Authorizing Provider  amLODipine (NORVASC) 10 MG tablet Take 10 mg by mouth daily. 08/18/21  Yes [provider]  benzonatate (TESSALON) 200 MG capsule Take 400 mg by mouth 2 (two) times daily. 11/15/22  Yes [provider]  dextromethorphan (DELSYM) 30 MG/5ML liquid Take 10 mLs by mouth 2 (two) times daily.   Yes [provider]  doxycycline (VIBRAMYCIN) 100 MG capsule Take 1 capsule (100 mg total) by mouth 2 (two) times daily for 7 days. 12/02/22 12/09/22 Yes Gwendalynn Eckstrom, Britta Mccreedy, MD  DULERA 200-5 MCG/ACT AERO Inhale 2  puffs into the lungs 2 (two) times daily. 11/05/22  Yes [provider]  FLUoxetine (PROZAC) 40 MG capsule Take 1 capsule (40 mg total) by mouth daily. 06/06/20  Yes Thresa Ross, MD  fluticasone (FLONASE) 50 MCG/ACT nasal spray Place 1 spray into both nostrils 2 (two) times daily. 11/05/22  Yes [provider]  losartan (COZAAR) 100 MG tablet Take 100 mg by mouth daily. 08/18/21  Yes [provider]  Olopatadine HCl 0.2 % SOLN Apply 1 drop to eye 2 (two) times daily. 12/02/22  Yes Claiborne Rigg, NP  promethazine-dextromethorphan (PROMETHAZINE-DM) 6.25-15 MG/5ML syrup Take 5 mLs by mouth 3 (three) times daily as needed.  11/19/22  Yes [provider]  ARNUITY ELLIPTA 100 MCG/ACT AEPB Inhale 1 puff into the lungs daily.    [provider]  augmented betamethasone dipropionate (DIPROLENE-AF) 0.05 % ointment Apply 1 Application topically 2 (two) times daily. 07/12/22   [provider]  benzonatate (TESSALON) 100 MG capsule Take 1 capsule (100 mg total) by mouth every 8 (eight) hours as needed for cough. 11/25/21   Gustavus Bryant, FNP  lamoTRIgine (LAMICTAL) 25 MG tablet Take 25 mg by mouth as directed.    [provider]  omeprazole (PRILOSEC) 40 MG capsule Take 40 mg by mouth at bedtime. 11/05/22   [provider]    Family History Family History  Problem Relation Age of Onset   Hypertension Mother    Breast cancer Mother    Heart disease Mother    Hypertension Father    Diabetes Father    Gout Father    Diabetes Maternal Grandmother    Diabetes Maternal Grandfather     Social History Social History   Tobacco Use   Smoking status: Former    Types: Cigars    Quit date: 07/16/2016    Years since quitting: 6.3   Smokeless tobacco: Never   Tobacco comments:    7 CIGARS IN A PACK -SMOKES ONE PACK DAILY  Vaping Use   Vaping status: Never Used  Substance Use Topics   Alcohol use: Yes    Comment: RARELY   Drug use: No     Allergies   Succinylcholine chloride   Review of Systems Review of Systems As per HPI  Physical Exam Triage Vital Signs ED Triage Vitals  Encounter Vitals Group     BP 12/02/22 0828 (!) 155/77     Systolic BP Percentile --      Diastolic BP Percentile --      Pulse Rate 12/02/22 0828 77     Resp 12/02/22 0828 18     Temp --      Temp Source 12/02/22 0828 Oral     SpO2 12/02/22 0828 96 %     Weight 12/02/22 0822 (!) 338 lb 10 oz (153.6 kg)     Height 12/02/22 0822 5\' 6"  (1.676 m)     Head Circumference --      Peak Flow --      Pain Score 12/02/22 0820 0     Pain Loc --      Pain Education --      Exclude from Growth  Chart --    No data found.  Updated Vital Signs BP (!) 155/77 (BP Location: Right Arm) Comment: Will recheck at end of visit if needed.  Pulse 77   Resp 18   Ht 5\' 6"  (1.676 m)   Wt (!) 153.6 kg   LMP 11/19/2022 (Exact Date)  SpO2 96%   BMI 54.66 kg/m   Visual Acuity Right Eye Distance:   Left Eye Distance:   Bilateral Distance:    Right Eye Near:   Left Eye Near:    Bilateral Near:     Physical Exam Vitals and nursing note reviewed.  Constitutional:      General: She is not in acute distress.    Appearance: She is not ill-appearing.  Eyes:     Extraocular Movements: Extraocular movements intact.     Pupils: Pupils are equal, round, and reactive to light.     Comments: Bilateral conjunctival erythema.  Cardiovascular:     Rate and Rhythm: Normal rate and regular rhythm.  Pulmonary:     Effort: Pulmonary effort is normal.     Breath sounds: Normal breath sounds.  Neurological:     Mental Status: She is alert.      UC Treatments / Results  Labs (all labs ordered are listed, but only abnormal results are displayed) Labs Reviewed  RPR  HIV ANTIBODY (ROUTINE TESTING W REFLEX)  CERVICOVAGINAL ANCILLARY ONLY    EKG   Radiology No results found.  Procedures Procedures (including critical care time)  Medications Ordered in UC Medications - No data to display  Initial Impression / Assessment and Plan / UC Course  I have reviewed the triage vital signs and the nursing notes.  Pertinent labs & imaging results that were available during my care of the patient were reviewed by me and considered in my medical decision making (see chart for details).     1.  Exposure to chlamydia: Doxycycline 100 mg twice daily for 7 days STI precautions given Cervicovaginal swab for GC/chlamydia/trichomonas Eye redness may be related to chlamydia infection Will call patient with recommendations if labs are abnormal. Return precautions given. Final Clinical  Impressions(s) / UC Diagnoses   Final diagnoses:  Exposure to chlamydia     Discharge Instructions      Please abstain from sexual intercourse for 7 days to allow the medications to work Please take the medications with a glass of water Will call you with recommendations if labs are abnormal If you have worsening symptoms please return to urgent care for further evaluation   ED Prescriptions     Medication Sig Dispense Auth. Provider   doxycycline (VIBRAMYCIN) 100 MG capsule Take 1 capsule (100 mg total) by mouth 2 (two) times daily for 7 days. 14 capsule Georgia Delsignore, Britta Mccreedy, MD      PDMP not reviewed this encounter.   Merrilee Jansky, MD 12/02/22 [phone]

## 2022-12-02 NOTE — ED Triage Notes (Signed)
"  I am here for STI Testing, my sexual partner tested positive for chlamydia". No current symptoms. No abnormal discharge. No dysuria. No urinary problems.

## 2022-12-03 LAB — CERVICOVAGINAL ANCILLARY ONLY
Chlamydia: NEGATIVE
Comment: NEGATIVE
Comment: NEGATIVE
Comment: NORMAL
Neisseria Gonorrhea: NEGATIVE
Trichomonas: NEGATIVE

## 2022-12-04 LAB — RPR: RPR Ser Ql: NONREACTIVE

## 2022-12-04 LAB — HIV ANTIBODY (ROUTINE TESTING W REFLEX): HIV Screen 4th Generation wRfx: NONREACTIVE

## 2023-05-04 ENCOUNTER — Ambulatory Visit: Admission: EM | Admit: 2023-05-04 | Discharge: 2023-05-04 | Disposition: A | Discharge status: Home / Self Care

## 2023-05-04 DIAGNOSIS — J069 Acute upper respiratory infection, unspecified: Secondary | ICD-10-CM | POA: Diagnosis not present

## 2023-05-04 LAB — POCT MONO SCREEN (KUC): Mono, POC: NEGATIVE

## 2023-05-04 LAB — POCT RAPID STREP A (OFFICE): Rapid Strep A Screen: NEGATIVE

## 2023-05-04 LAB — POC COVID19/FLU A&B COMBO
Covid Antigen, POC: NEGATIVE
Influenza A Antigen, POC: NEGATIVE
Influenza B Antigen, POC: NEGATIVE

## 2023-05-04 MED ORDER — PSEUDOEPH-BROMPHEN-DM 30-2-10 MG/5ML PO SYRP: 10.0000 mL | ORAL_SOLUTION | Freq: Four times a day (QID) | ORAL | 0 refills | Status: AC | PRN

## 2023-05-04 MED ORDER — METHYLPREDNISOLONE 4 MG PO TBPK
ORAL_TABLET | ORAL | 0 refills | Status: AC
Start: 2023-05-04 — End: ?

## 2023-05-04 NOTE — ED Provider Notes (Signed)
 [facility]    CSN: [id number] Arrival date & time: 05/04/23  0801      History   Chief Complaint Chief Complaint  Patient presents with   Cough    Family of 2    HPI Anna [name] is a 43 y.o. female.   Subjective:   Anna [name] is a 43 year old female with a medical history of hypertension, asthma, depression, and obesity who presents with symptoms consistent with an upper respiratory infection. She reports a scratchy throat that has worsened into a sore throat, along with a nonproductive cough, body aches, nasal congestion, sneezing, fatigue, and head pressure. She also describes a burning sensation in her chest when she coughs. She denies fever, chills, nausea, vomiting, diarrhea, wheezing, or shortness of breath. She has not taken any medications for her symptoms. She reports positive sick contacts, including her mother who was recently diagnosed with mononucleosis and her daughter who has been sick for the past week and is also being evaluated today.  The following portions of the patient's history were reviewed and updated as appropriate: allergies, current medications, past family history, past medical history, past social history, past surgical history, and problem list.       Past Medical History:  Diagnosis Date   Anxiety    B12 deficiency    Chlamydia    Depression    Hypertension    Malignant hyperthermia    PT STATES DX WITH MALIGNANT HYPERTHERMIA WHEN SHE HAD ADNOIDECTOMY AT AGE 33-DID WEAR ALERT BRACELET AS A CHILD-BUT NO LONGER WEARS.   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   Trichomonas infection 07/14/2022   Dysuria 07/12/2022   Vaginal irritation 07/12/2022   Vulvar itching 07/12/2022   Iron deficiency anemia due to chronic blood loss 11/13/2021   Dysfunctional uterine bleeding 11/13/2021   Disease due to 2019 novel coronavirus 02/16/2020   Pharyngitis, streptococcal 02/16/2020   Cesarean delivery, delivered, current hospitalization 02/22/2017   Chronic hypertension during pregnancy, antepartum 02/19/2017   Indication for care in labor and delivery, antepartum 02/19/2017   Pregnancy 08/09/2016    Past Surgical History:  Procedure Laterality Date   CESAREAN SECTION N/A 02/22/2017   Procedure: CESAREAN SECTION;  Surgeon: Reggy Capers, MD;  Location: [facility];  Service: Obstetrics;  Laterality: N/A;   CHOLECYSTECTOMY  05/16/2011   Procedure: LAPAROSCOPIC CHOLECYSTECTOMY WITH INTRAOPERATIVE CHOLANGIOGRAM;  Surgeon: Brandy Cal. Cornett, MD;  Location: [facility] ORS;  Service: General;  Laterality: N/A;  Laparoscopic Cholecystectomy and Cholangiogram    wisdom teeth extracted  01/22/1998   pt states surgery was done at [facility]-BECAUSE OF HER PAST HX MALIGNANT HYPERTHERMIA-SURGERY BY DR. Carmen Chol    OB History     Gravida  3   Para  1   Term      Preterm  1   AB  2   Living  1      SAB  1   IAB  1   Ectopic      Multiple  0   Live Births  1  Home Medications    Prior to Admission medications   Medication Sig Start Date End Date Taking? Authorizing Provider  Ascorbic Acid (VITAMIN C) 1000 MG tablet Take 1,000 mg by mouth daily.   Yes [provider]  brompheniramine-pseudoephedrine-DM 30-2-10 MG/5ML syrup Take 10 mLs by mouth every 6 (six) hours as needed (cough and congestion). 05/04/23  Yes Maryruth Sol, FNP  cholecalciferol (VITAMIN D3) 25 MCG (1000 UNIT) tablet Take 1,000 Units by mouth daily.   Yes [provider]  Ferrous Sulfate (IRON PO) Take 325 mg by  mouth daily at 2 PM.   Yes [provider]  FLUoxetine (PROZAC) 40 MG capsule Take 1 capsule (40 mg total) by mouth daily. 06/06/20  Yes Wray Heady, MD  losartan (COZAAR) 100 MG tablet Take 100 mg by mouth daily. 08/18/21  Yes [provider]  LYSINE PO Take 500 mg by mouth daily.   Yes [provider]  methylPREDNISolone (MEDROL DOSEPAK) 4 MG TBPK tablet Take as directed 05/04/23  Yes Lorita Forinash, FNP  WEGOVY 0.25 MG/0.5ML SOAJ Inject 0.25 mg into the skin once a week. 04/26/23  Yes [provider]  albuterol (VENTOLIN HFA) 108 (90 Base) MCG/ACT inhaler Inhale 2 puffs into the lungs every 4 (four) hours as needed for wheezing or shortness of breath.    [provider]  amLODipine (NORVASC) 10 MG tablet Take 10 mg by mouth daily. 08/18/21   [provider]  ARNUITY ELLIPTA 100 MCG/ACT AEPB Inhale 1 puff into the lungs daily.    [provider]  DULERA 200-5 MCG/ACT AERO Inhale 2 puffs into the lungs 2 (two) times daily. 11/05/22   [provider]  fluticasone (FLONASE) 50 MCG/ACT nasal spray Place 1 spray into both nostrils 2 (two) times daily. 11/05/22   [provider]    Family History Family History  Problem Relation Age of Onset   Hypertension Mother    Breast cancer Mother    Heart disease Mother    Hypertension Father    Diabetes Father    Gout Father    Diabetes Maternal Grandmother    Diabetes Maternal Grandfather     Social History Social History   Tobacco Use   Smoking status: Former    Types: Cigars    Quit date: 07/16/2016    Years since quitting: 6.8   Smokeless tobacco: Never   Tobacco comments:    7 CIGARS IN A PACK -SMOKES ONE PACK DAILY  Vaping Use   Vaping status: Never Used  Substance Use Topics   Alcohol use: Yes    Comment: RARELY   Drug use: No     Allergies   Succinylcholine chloride   Review of Systems Review of Systems  Constitutional:  Positive for chills,  fatigue and fever.  HENT:  Positive for congestion, rhinorrhea, sinus pressure, sneezing and sore throat. Negative for ear pain and trouble swallowing.   Respiratory:  Positive for cough. Negative for shortness of breath and wheezing.   Gastrointestinal:  Negative for diarrhea, nausea and vomiting.  Musculoskeletal:  Positive for myalgias.  Neurological:  Positive for headaches.  All other systems reviewed and are negative.    Physical Exam Triage Vital Signs ED Triage Vitals  Encounter Vitals Group     BP 05/04/23 0819 (!) 178/83     Systolic BP Percentile --      Diastolic BP Percentile --      Pulse Rate 05/04/23 0819 76     Resp 05/04/23  0819 18     Temp 05/04/23 0819 99 F (37.2 C)     Temp Source 05/04/23 0819 Oral     SpO2 05/04/23 0819 96 %     Weight 05/04/23 0821 (!) 338 lb (153.3 kg)     Height 05/04/23 0821 5\' 6"  (1.676 m)     Head Circumference --      Peak Flow --      Pain Score 05/04/23 0819 0     Pain Loc --      Pain Education --      Exclude from Growth Chart --    No data found.  Updated Vital Signs BP (!) 154/83 (BP Location: Right Arm)   Pulse 80   Temp 99 F (37.2 C) (Oral)   Resp 20   Ht 5\' 6"  (1.676 m)   Wt (!) 338 lb (153.3 kg)   LMP 04/04/2023 (Exact Date)   SpO2 96%   BMI 54.55 kg/m   Visual Acuity Right Eye Distance:   Left Eye Distance:   Bilateral Distance:    Right Eye Near:   Left Eye Near:    Bilateral Near:     Physical Exam Vitals reviewed.  Constitutional:      General: She is not in acute distress.    Appearance: Normal appearance. She is obese. She is not ill-appearing, toxic-appearing or diaphoretic.  HENT:     Head: Normocephalic.     Right Ear: Tympanic membrane, ear canal and external ear normal.     Left Ear: Tympanic membrane, ear canal and external ear normal.     Nose: Congestion present.     Mouth/Throat:     Mouth: Mucous membranes are moist.     Pharynx: No oropharyngeal exudate or posterior  oropharyngeal erythema.  Eyes:     Conjunctiva/sclera: Conjunctivae normal.  Cardiovascular:     Rate and Rhythm: Normal rate.  Pulmonary:     Effort: Pulmonary effort is normal. No respiratory distress.     Breath sounds: Normal breath sounds. No wheezing.  Musculoskeletal:        General: Normal range of motion.     Cervical back: Normal range of motion and neck supple.  Lymphadenopathy:     Cervical: No cervical adenopathy (tenderness without any adenopathy).  Skin:    General: Skin is warm and dry.  Neurological:     General: No focal deficit present.     Mental Status: She is alert and oriented to person, place, and time.      UC Treatments / Results  Labs (all labs ordered are listed, but only abnormal results are displayed) Labs Reviewed  POCT MONO SCREEN ([facility]) - Normal  POC COVID19/FLU A&B COMBO - Normal  POCT RAPID STREP A (OFFICE) - Normal    EKG   Radiology No results found.  Procedures Procedures (including critical care time)  Medications Ordered in UC Medications - No data to display  Initial Impression / Assessment and Plan / UC Course  I have reviewed the triage vital signs and the nursing notes.  Pertinent labs & imaging results that were available during my care of the patient were reviewed by me and considered in my medical decision making (see chart for details).     43 year old female with a medical history of hypertension, asthma, depression, and obesity who presents with symptoms consistent with an upper respiratory infection. Patient is afebrile and nontoxic. Physical exam is unremarkable without any significant findings. Rapid testing for flu,  COVID, strep, and mono are all negative. Symptoms are consistent with a viral upper respiratory infection. A Medrol Dose Pack, Bromfed-DM, and Flonase have been prescribed for symptom relief. Supportive care measures reviewed. Emergency precautions discussed. Follow up with primary care provider as  needed.  Today's evaluation has revealed no signs of a dangerous process. Discussed diagnosis with patient and/or guardian. Patient and/or guardian aware of their diagnosis, possible red flag symptoms to watch out for and need for close follow up. Patient and/or guardian understands verbal and written discharge instructions. Patient and/or guardian comfortable with plan and disposition.  Patient and/or guardian has a clear mental status at this time, good insight into illness (after discussion and teaching) and has clear judgment to make decisions regarding their care  Documentation was completed with the aid of voice recognition software. Transcription may contain typographical errors. Final Clinical Impressions(s) / UC Diagnoses   Final diagnoses:  Upper respiratory tract infection, unspecified type     Discharge Instructions      NEGATIVE FLU, COVID, STREP & MONO.   Your symptoms are most likely caused by a respiratory infection, which affects areas like your nose, throat, or lungs. This type of infection is usually caused by a virus. Since your illness is caused by a virus, antibiotics won't help because they only treat infections caused by bacteria.  Take the medications that were prescribed to you as directed. If you have a fever, headache, or body aches, you can also take Tylenol or ibuprofen to help you feel more comfortable. Be sure to drink plenty of fluids to stay hydrated--aim for enough to keep your urine a pale yellow color. This will also help to thin mucus and make it easier to clear from your body. Using a cool mist humidifier at home to keep humidity levels above 50% can be helpful. You can also inhale steam for 10 to 15 minutes, 3 to 4 times a day. This can be done by sitting in the bathroom with a hot shower running, or by using over-the-counter vapor shower tablets to help with nasal congestion. Try to avoid cool or dry air as much as possible. When you sleep, keep your head  elevated to help reduce post-nasal drainage. Be sure to get enough rest every night to support your recovery.   If your symptoms get worse or if you develop any new or concerning symptoms, go to the emergency room right away. If you're not feeling better in a few days, follow up with your primary care provider.           ED Prescriptions     Medication Sig Dispense Auth. Provider   methylPREDNISolone (MEDROL DOSEPAK) 4 MG TBPK tablet Take as directed 21 tablet Maryruth Sol, FNP   brompheniramine-pseudoephedrine-DM 30-2-10 MG/5ML syrup Take 10 mLs by mouth every 6 (six) hours as needed (cough and congestion). 120 mL Maryruth Sol, FNP      PDMP not reviewed this encounter.   Maryruth Sol, [place] 05/04/23 1032

## 2023-05-04 NOTE — Discharge Instructions (Addendum)
 NEGATIVE FLU, COVID, STREP & MONO.   Your symptoms are most likely caused by a respiratory infection, which affects areas like your nose, throat, or lungs. This type of infection is usually caused by a virus. Since your illness is caused by a virus, antibiotics won't help because they only treat infections caused by bacteria.  Take the medications that were prescribed to you as directed. If you have a fever, headache, or body aches, you can also take Tylenol or ibuprofen to help you feel more comfortable. Be sure to drink plenty of fluids to stay hydrated--aim for enough to keep your urine a pale yellow color. This will also help to thin mucus and make it easier to clear from your body. Using a cool mist humidifier at home to keep humidity levels above 50% can be helpful. You can also inhale steam for 10 to 15 minutes, 3 to 4 times a day. This can be done by sitting in the bathroom with a hot shower running, or by using over-the-counter vapor shower tablets to help with nasal congestion. Try to avoid cool or dry air as much as possible. When you sleep, keep your head elevated to help reduce post-nasal drainage. Be sure to get enough rest every night to support your recovery.   If your symptoms get worse or if you develop any new or concerning symptoms, go to the emergency room right away. If you're not feeling better in a few days, follow up with your primary care provider.

## 2023-05-04 NOTE — ED Triage Notes (Signed)
 Here with Family. "I started Wednesday with a cough and it has gotten bad enough to make my chest sore". No fever. No sob. No wheezing.

## 2023-09-02 ENCOUNTER — Encounter: Payer: Self-pay | Admitting: Nurse Practitioner

## 2023-09-02 ENCOUNTER — Other Ambulatory Visit: Payer: Self-pay | Admitting: Nurse Practitioner

## 2023-09-02 DIAGNOSIS — K760 Fatty (change of) liver, not elsewhere classified: Secondary | ICD-10-CM

## 2023-09-02 DIAGNOSIS — K7401 Hepatic fibrosis, early fibrosis: Secondary | ICD-10-CM

## 2023-09-03 ENCOUNTER — Other Ambulatory Visit

## 2023-09-12 ENCOUNTER — Other Ambulatory Visit

## 2023-09-16 ENCOUNTER — Other Ambulatory Visit

## 2023-09-18 ENCOUNTER — Ambulatory Visit
Admission: RE | Admit: 2023-09-18 | Discharge: 2023-09-18 | Disposition: A | Discharge status: Home / Self Care | Source: Ambulatory Visit | Attending: Nurse Practitioner

## 2023-09-18 DIAGNOSIS — K7401 Hepatic fibrosis, early fibrosis: Secondary | ICD-10-CM

## 2023-09-18 DIAGNOSIS — K760 Fatty (change of) liver, not elsewhere classified: Secondary | ICD-10-CM
